# Patient Record
Sex: Male | Born: 1980 | Race: Black or African American | Hispanic: No | Marital: Single | Smoking: Former smoker
Health system: Southern US, Community
[De-identification: ages and names within clinical notes are randomized; demographics above are authoritative.]

## PROBLEM LIST (undated history)

## (undated) DIAGNOSIS — J45909 Unspecified asthma, uncomplicated: Secondary | ICD-10-CM

## (undated) DIAGNOSIS — K219 Gastro-esophageal reflux disease without esophagitis: Secondary | ICD-10-CM

## (undated) DIAGNOSIS — I10 Essential (primary) hypertension: Secondary | ICD-10-CM

## (undated) DIAGNOSIS — R51 Headache: Secondary | ICD-10-CM

## (undated) DIAGNOSIS — R519 Headache, unspecified: Secondary | ICD-10-CM

---

## 2003-07-01 ENCOUNTER — Emergency Department (HOSPITAL_COMMUNITY): Admission: EM | Admit: 2003-07-01 | Discharge: 2003-07-01 | Payer: Self-pay | Admitting: Emergency Medicine

## 2006-01-01 ENCOUNTER — Emergency Department (HOSPITAL_COMMUNITY): Admission: EM | Admit: 2006-01-01 | Discharge: 2006-01-01 | Payer: Self-pay | Admitting: Emergency Medicine

## 2013-11-15 ENCOUNTER — Encounter (HOSPITAL_COMMUNITY): Payer: Self-pay | Admitting: Emergency Medicine

## 2013-11-15 DIAGNOSIS — Y929 Unspecified place or not applicable: Secondary | ICD-10-CM | POA: Insufficient documentation

## 2013-11-15 DIAGNOSIS — Y939 Activity, unspecified: Secondary | ICD-10-CM | POA: Insufficient documentation

## 2013-11-15 DIAGNOSIS — S51809A Unspecified open wound of unspecified forearm, initial encounter: Secondary | ICD-10-CM | POA: Insufficient documentation

## 2013-11-15 DIAGNOSIS — F17200 Nicotine dependence, unspecified, uncomplicated: Secondary | ICD-10-CM | POA: Insufficient documentation

## 2013-11-15 DIAGNOSIS — W540XXA Bitten by dog, initial encounter: Secondary | ICD-10-CM | POA: Insufficient documentation

## 2013-11-15 DIAGNOSIS — Z23 Encounter for immunization: Secondary | ICD-10-CM | POA: Insufficient documentation

## 2013-11-15 NOTE — ED Notes (Signed)
The pt was bitten by his own dog earlier tonight [facility]  Bites to the lt forearm and wrist.  Good finger movement

## 2013-11-16 ENCOUNTER — Emergency Department (HOSPITAL_COMMUNITY)
Admission: EM | Admit: 2013-11-16 | Discharge: 2013-11-16 | Disposition: A | Discharge status: Home / Self Care | Payer: Self-pay | Attending: Emergency Medicine | Admitting: Emergency Medicine

## 2013-11-16 DIAGNOSIS — S51859A Open bite of unspecified forearm, initial encounter: Secondary | ICD-10-CM

## 2013-11-16 MED ORDER — TETANUS-DIPHTH-ACELL PERTUSSIS 5-2.5-18.5 LF-MCG/0.5 IM SUSP
0.5000 mL | Freq: Once | INTRAMUSCULAR | Status: AC
  Administered 2013-11-16: 0.5 mL via INTRAMUSCULAR
  Filled 2013-11-16: qty 0.5

## 2013-11-16 MED ORDER — RABIES VACCINE, PCEC IM SUSR
1.0000 mL | Freq: Once | INTRAMUSCULAR | Status: AC
Start: 2013-11-16 — End: 2013-11-16
  Administered 2013-11-16: 1 mL via INTRAMUSCULAR
  Filled 2013-11-16: qty 1

## 2013-11-16 MED ORDER — RABIES IMMUNE GLOBULIN 150 UNIT/ML IM INJ
20.0000 [IU]/kg | INJECTION | Freq: Once | INTRAMUSCULAR | Status: AC
  Administered 2013-11-16: 2400 [IU]
  Filled 2013-11-16: qty 16

## 2013-11-16 MED ORDER — AMOXICILLIN-POT CLAVULANATE 875-125 MG PO TABS: 1.0000 | ORAL_TABLET | Freq: Two times a day (BID) | ORAL | Status: DC

## 2013-11-16 NOTE — ED Notes (Addendum)
Bites noted to L proximal and distal FA. Bitten by his own pitbull dog at ~ 2200. Pt has had the dog for ~10 yrs. Td NOT UTD. Dogs vaccines NOT UTD. Dog is an outdoor dog and has NOT been acting right/normal. N/o h/o same. Pt agreeable to & prefers to receive rabies vaccine & IG. Also agreeable to animal control and quarantine. Plans on getting rid of dog. Bleeding controlled, no active bleeding at this time. ROM and CMS intact. BUE strong. Pt confirmed address & "best phone number" as [phone].

## 2013-11-16 NOTE — ED Provider Notes (Signed)
CSN: [id number]     Arrival date & time 11/15/13  2334 History   First MD Initiated Contact with Patient 11/16/13 0057     Chief Complaint  Patient presents with  . Animal Bite   (Consider location/radiation/quality/duration/timing/severity/associated sxs/prior Treatment) HPI Comments: Patient is a 33 year old male with no significant past medical history presents today after he was bitten by his dog earlier today. Dog is not UTD on his shots and patient denies UTD tetanus. Patient has 2 bite marks on the volar aspect of his left forearm, just distal to his elbow and just proximal to his wrist. He endorses some associated soft tissue swelling. Patient denies associated numbness/tingling, extremity weakness, and pallor. Patient is concerned about whether his dog may be rabid, because he states his dog has not been acting normally recently. Patient states his dog is primarily outdoors.  Patient is a 33 y.o. male presenting with animal bite. The history is provided by the patient. No language interpreter was used.  Animal Bite Associated symptoms: no fever and no numbness     History reviewed. No pertinent past medical history. History reviewed. No pertinent past surgical history. No family history on file. History  Substance Use Topics  . Smoking status: Current Every Day Smoker  . Smokeless tobacco: Not on file  . Alcohol Use: Yes    Review of Systems  Constitutional: Negative for fever.  Musculoskeletal: Positive for myalgias.  Skin: Positive for wound. Negative for pallor.  Neurological: Negative for weakness and numbness.  All other systems reviewed and are negative.    Allergies  Review of patient's allergies indicates no known allergies.  Home Medications   Current Outpatient Rx  Name  Route  Sig  Dispense  Refill  . albuterol (PROVENTIL HFA;VENTOLIN HFA) 108 (90 BASE) MCG/ACT inhaler   Inhalation   Inhale 2 puffs into the lungs every 6 (six) hours as needed for  wheezing or shortness of breath.         . Ibuprofen-Diphenhydramine Cit (ADVIL PM PO)   Oral   Take 1 tablet by mouth at bedtime as needed (sleep).         Marland Kitchen. amoxicillin-clavulanate (AUGMENTIN) 875-125 MG per tablet   Oral   Take 1 tablet by mouth 2 (two) times daily.   14 tablet   0    BP 179/115  Pulse 110  Temp(Src) 98.8 F (37.1 C) (Oral)  Resp 18  Wt 268 lb (121.564 kg)  SpO2 98% Physical Exam  Nursing note and vitals reviewed. Constitutional: He is oriented to person, place, and time. He appears well-developed and well-nourished. No distress.  HENT:  Head: Normocephalic and atraumatic.  Eyes: Conjunctivae and EOM are normal. No scleral icterus.  Neck: Normal range of motion.  Cardiovascular: Normal rate, regular rhythm and intact distal pulses.   Pulmonary/Chest: Effort normal. No respiratory distress.  Musculoskeletal: Normal range of motion.       Left forearm: He exhibits tenderness, swelling and laceration (Abrasion and superficial laceration x 3 each approx 2cm). He exhibits no bony tenderness, no edema and no deformity.       Arms: Normal range of motion of left elbow. Normal strength against resistance with flexion and extension of left elbow joint. Normal wrist movement.  Neurological: He is alert and oriented to person, place, and time.  No gross sensory deficits appreciated. Patient moves extremities without ataxia. Equal grip strength bilaterally.  Skin: Skin is warm and dry. No rash noted. He is not diaphoretic. No  pallor.  Psychiatric: He has a normal mood and affect. His behavior is normal.    ED Course  Procedures (including critical care time) Labs Review Labs Reviewed - No data to display Imaging Review No results found.  EKG Interpretation   None       MDM   1. Animal bite of forearm    Uncomplicated animal bite. Patient neurovascularly intact with normal strength and sensation in his left upper extremity. Tetanus updated in ED.  This patient is an outdoor dog does not updated on his rabies shots and has been acting unusually, will treat patient with first course of rabies vaccine today. Patient stable for discharge with instruction to return on day 3, 7, and 14 for subsequent rabies vaccinations. Will also discharge with seven-day course of Augmentin. Return precautions discussed and patient agreeable to plan with no unaddressed concerns.    Antony Madura, PA-C 11/16/13 [phone]

## 2013-11-16 NOTE — ED Notes (Signed)
Wound cleaned with saline & betadine. Bacitracin applied.

## 2013-11-16 NOTE — Discharge Instructions (Signed)
Return for your subsequent rabies vaccinations on 11/19/2013, 11/23/2013, and 11/30/2013. Take Augmentin as prescribed. Follow up with your primary care doctor as needed.  Animal Bite An animal bite can result in a scratch on the skin, deep open cut, puncture of the skin, crush injury, or tearing away of the skin or a body part. Dogs are responsible for most animal bites. Children are bitten more often than adults. An animal bite can range from very mild to more serious. A small bite from your house pet is no cause for alarm. However, some animal bites can become infected or injure a bone or other tissue. You must seek medical care if:  The skin is broken and bleeding does not slow down or stop after 15 minutes.  The puncture is deep and difficult to clean (such as a cat bite).  Pain, warmth, redness, or pus develops around the wound.  The bite is from a stray animal or rodent. There may be a risk of rabies infection.  The bite is from a snake, raccoon, skunk, fox, coyote, or bat. There may be a risk of rabies infection.  The person bitten has a chronic illness such as diabetes, liver disease, or cancer, or the person takes medicine that lowers the immune system.  There is concern about the location and severity of the bite. It is important to clean and protect an animal bite wound right away to prevent infection. Follow these steps:  Clean the wound with plenty of water and soap.  Apply an antibiotic cream.  Apply gentle pressure over the wound with a clean towel or gauze to slow or stop bleeding.  Elevate the affected area above the heart to help stop any bleeding.  Seek medical care. Getting medical care within 8 hours of the animal bite leads to the best possible outcome. DIAGNOSIS  Your caregiver will most likely:  Take a detailed history of the animal and the bite injury.  Perform a wound exam.  Take your medical history. Blood tests or X-rays may be performed. Sometimes,  infected bite wounds are cultured and sent to a lab to identify the infectious bacteria.  TREATMENT  Medical treatment will depend on the location and type of animal bite as well as the patient's medical history. Treatment may include:  Wound care, such as cleaning and flushing the wound with saline solution, bandaging, and elevating the affected area.  Antibiotics.  Tetanus immunization.  Rabies immunization.  Leaving the wound open to heal. This is often done with animal bites, due to the high risk of infection. However, in certain cases, wound closure with stitches, wound adhesive, skin adhesive strips, or staples may be used. Infected bites that are left untreated may require intravenous (IV) antibiotics and surgical treatment in the hospital. HOME CARE INSTRUCTIONS  Follow your caregiver's instructions for wound care.  Take all medicines as directed.  If your caregiver prescribes antibiotics, take them as directed. Finish them even if you start to feel better.  Follow up with your caregiver for further exams or immunizations as directed. You may need a tetanus shot if:  You cannot remember when you had your last tetanus shot.  You have never had a tetanus shot.  The injury broke your skin. If you get a tetanus shot, your arm may swell, get red, and feel warm to the touch. This is common and not a problem. If you need a tetanus shot and you choose not to have one, there is a rare chance  of getting tetanus. Sickness from tetanus can be serious. SEEK MEDICAL CARE IF:  You notice warmth, redness, soreness, swelling, pus discharge, or a bad smell coming from the wound.  You have a red line on the skin coming from the wound.  You have a fever, chills, or a general ill feeling.  You have nausea or vomiting.  You have continued or worsening pain.  You have trouble moving the injured part.  You have other questions or concerns. MAKE SURE YOU:  Understand these  instructions.  Will watch your condition.  Will get help right away if you are not doing well or get worse. Document Released: 07/05/2011 Document Revised: 01/09/2012 Document Reviewed: 07/05/2011 [facility] Patient Information 2014 [place], [place]. Laceration Care, Adult A laceration is a cut that goes through all layers of the skin. The cut goes into the tissue beneath the skin. HOME CARE For stitches (sutures) or staples:  Keep the cut clean and dry.  If you have a bandage (dressing), change it at least once a day. Change the bandage if it gets wet or dirty, or as told by your doctor.  Wash the cut with soap and water 2 times a day. Rinse the cut with water. Pat it dry with a clean towel.  Put a thin layer of medicated cream on the cut as told by your doctor.  You may shower after the first 24 hours. Do not soak the cut in water until the stitches are removed.  Only take medicines as told by your doctor.  Have your stitches or staples removed as told by your doctor. For skin adhesive strips:  Keep the cut clean and dry.  Do not get the strips wet. You may take a bath, but be careful to keep the cut dry.  If the cut gets wet, pat it dry with a clean towel.  The strips will fall off on their own. Do not remove the strips that are still stuck to the cut. For wound glue:  You may shower or take baths. Do not soak or scrub the cut. Do not swim. Avoid heavy sweating until the glue falls off on its own. After a shower or bath, pat the cut dry with a clean towel.  Do not put medicine on your cut until the glue falls off.  If you have a bandage, do not put tape over the glue.  Avoid lots of sunlight or tanning lamps until the glue falls off. Put sunscreen on the cut for the first year to reduce your scar.  The glue will fall off on its own. Do not pick at the glue. You may need a tetanus shot if:  You cannot remember when you had your last tetanus shot.  You have never had a  tetanus shot. If you need a tetanus shot and you choose not to have one, you may get tetanus. Sickness from tetanus can be serious. GET HELP RIGHT AWAY IF:   Your pain does not get better with medicine.  Your arm, hand, leg, or foot loses feeling (numbness) or changes color.  Your cut is bleeding.  Your joint feels weak, or you cannot use your joint.  You have painful lumps on your body.  Your cut is red, puffy (swollen), or painful.  You have a red line on the skin near the cut.  You have yellowish-white fluid (pus) coming from the cut.  You have a fever.  You have a bad smell coming from the cut or  bandage.  Your cut breaks open before or after stitches are removed.  You notice something coming out of the cut, such as wood or glass.  You cannot move a finger or toe. MAKE SURE YOU:   Understand these instructions.  Will watch your condition.  Will get help right away if you are not doing well or get worse. Document Released: 04/04/2008 Document Revised: 01/09/2012 Document Reviewed: 04/12/2011 [facility] Patient Information 2014 [place], [place].

## 2013-11-17 NOTE — ED Provider Notes (Signed)
Medical screening examination/treatment/procedure(s) were performed by non-physician practitioner and as supervising physician I was immediately available for consultation/collaboration.  EKG Interpretation   None         Julie Manly, MD 11/17/13 0210 

## 2013-11-21 ENCOUNTER — Encounter (HOSPITAL_COMMUNITY): Payer: Self-pay | Admitting: Emergency Medicine

## 2013-11-21 ENCOUNTER — Emergency Department (INDEPENDENT_AMBULATORY_CARE_PROVIDER_SITE_OTHER)
Admission: EM | Admit: 2013-11-21 | Discharge: 2013-11-21 | Disposition: A | Discharge status: Home / Self Care | Payer: Self-pay | Source: Home / Self Care

## 2013-11-21 DIAGNOSIS — Z203 Contact with and (suspected) exposure to rabies: Secondary | ICD-10-CM

## 2013-11-21 MED ORDER — RABIES VACCINE, PCEC IM SUSR
1.0000 mL | Freq: Once | INTRAMUSCULAR | Status: AC
  Administered 2013-11-21: 1 mL via INTRAMUSCULAR

## 2013-11-21 MED ORDER — RABIES VACCINE, PCEC IM SUSR
INTRAMUSCULAR | Status: AC
  Filled 2013-11-21: qty 1

## 2013-11-21 NOTE — ED Notes (Signed)
Pt is here for day 1 rabies vaccination Reports he did not come in on 1/20 as he was scheduled b/c he did not know how this process worked Voices no new concerns Alert w/no signs of acute distress.   Pt has been adv that he will be coming on: 01/25 (day 3) 01/28 (day 7) 01/31 (day 14)  Consulted w/Dr. Clinton SawyerWilliamson and he said it was ok.

## 2013-11-21 NOTE — Discharge Instructions (Signed)
Please return on  01/25 (day 3) 01/28 (day 7) 01/31 (day 14)

## 2014-10-14 ENCOUNTER — Encounter (HOSPITAL_COMMUNITY): Payer: Self-pay | Admitting: Emergency Medicine

## 2014-10-14 ENCOUNTER — Emergency Department (INDEPENDENT_AMBULATORY_CARE_PROVIDER_SITE_OTHER)
Admission: EM | Admit: 2014-10-14 | Discharge: 2014-10-14 | Disposition: A | Discharge status: Home / Self Care | Payer: Self-pay | Source: Home / Self Care | Attending: Emergency Medicine | Admitting: Emergency Medicine

## 2014-10-14 DIAGNOSIS — G43009 Migraine without aura, not intractable, without status migrainosus: Secondary | ICD-10-CM

## 2014-10-14 HISTORY — DX: Gastro-esophageal reflux disease without esophagitis: K21.9

## 2014-10-14 HISTORY — DX: Headache, unspecified: R51.9

## 2014-10-14 HISTORY — DX: Headache: R51

## 2014-10-14 MED ORDER — METOCLOPRAMIDE HCL 5 MG/ML IJ SOLN
INTRAMUSCULAR | Status: AC
  Filled 2014-10-14: qty 2

## 2014-10-14 MED ORDER — METOCLOPRAMIDE HCL 5 MG/ML IJ SOLN
10.0000 mg | Freq: Once | INTRAMUSCULAR | Status: AC
  Administered 2014-10-14: 10 mg via INTRAMUSCULAR

## 2014-10-14 MED ORDER — DICLOFENAC SODIUM 75 MG PO TBEC: DELAYED_RELEASE_TABLET | ORAL | Status: DC

## 2014-10-14 MED ORDER — KETOROLAC TROMETHAMINE 60 MG/2ML IM SOLN
60.0000 mg | Freq: Once | INTRAMUSCULAR | Status: AC
  Administered 2014-10-14: 60 mg via INTRAMUSCULAR

## 2014-10-14 MED ORDER — METOCLOPRAMIDE HCL 10 MG PO TABS: ORAL_TABLET | ORAL | Status: DC

## 2014-10-14 MED ORDER — KETOROLAC TROMETHAMINE 60 MG/2ML IM SOLN
INTRAMUSCULAR | Status: AC
  Filled 2014-10-14: qty 2

## 2014-10-14 NOTE — ED Notes (Signed)
C/o headache onset yesterday.  Has been having headaches severely for 3-4 yrs.  Has never had a CT scan.

## 2014-10-14 NOTE — ED Provider Notes (Signed)
Chief Complaint   No chief complaint on file.   History of Present Illness   Dakota Doyle is a 33 year old male has a 2 day history of severe, generalized, pressure-like headache rated 10 over 10 in intensity. This has been associated with dizziness, light and sound sensitivity, as worse with activity. He denies any nausea or vomiting. He's had no fever, chills, diplopia, blurry vision, or any Visual disturbance. He denies nasal congestion, rhinorrhea, sore throat, or stiff neck. He's had no paresthesias, numbness, tingling, weakness, difficulty walking, difficulty using his hands, difficulty with speech or swallowing, problems with equilibrium, or balance.  The patient states he has had similar headaches for several years. He thinks these are tension type headaches. He has seen a physician for these in the past, but never taken any specific treatment for them. He's never seen a headache specialist. He was diagnosed with migraines when he was a child. He's tried over-the-counter medications such as Aleve and Excedrin with fair results. The patient denies that this is the worst headache of his life. He states that it similar to the typical headaches he's gotten in the past.  He does not take anticoagulants and has been no head or neck trauma. He denies any fever or stiff neck. Headache was not sudden in onset. No one else in the family or in the household has had a similar headache. He denies any eye pain or blurry vision he denies any jaw claudication. He's had no history of blood clots or thrombophlebitis.  Review of Systems   Other than as noted above, the patient denies any of the following symptoms: Systemic:  No fever, chills, photophobia, or stiff neck. Eye:  No blurred vision, or diplopia. ENT:  No nasal congestion, rhinorrhea, sinus pressure or pain, or sore throat.  No jaw claudication. Neuro:  No paresthesias, loss of consciousness, seizure activity, muscle weakness, trouble with  coordination or gait, trouble speaking or swallowing. Psych:  No depression, anxiety or trouble sleeping.  PMFSH   Past medical history, family history, social history, meds, and allergies were reviewed.    Physical Examination    Vital signs:  BP 139/94 mmHg  Pulse 82  Temp(Src) 98.8 F (37.1 C) (Oral)  Resp 18  SpO2 95% General:  Alert and oriented.  In no distress. Eye:  Lids and conjunctivas normal.  PERRL,  Full EOMs.  Fundi were not well-visualized.  ENT:  No cranial or facial tenderness to palpation.  TMs and canals clear.  Nasal mucosa was normal and uncongested without any drainage. No intra oral lesions, pharynx clear, mucous membranes moist, dentition normal. Neck:  Supple, full ROM, no tenderness to palpation.  No adenopathy or mass. Neuro:  Alert and orented times 3.  Speech was clear, fluent, and appropriate.  Cranial nerves intact. No pronator drift, muscle strength normal. Finger to nose normal.  DTRs were 2+ and symmetrical.Station and gait were normal.  Romberg's sign was normal.  Able to perform tandem gait well. Psych:  Normal affect.   Course in Urgent Care Center   The following medications were given:  Medications  ketorolac (TORADOL) injection 60 mg   metoCLOPramide (REGLAN) injection 10 mg    Assessment   The encounter diagnosis was Migraine without aura and without status migrainosus, not intractable.  There is no evidence of subarachnoid hemorrhage, meningitis, encephalitis, subdural hematoma, epidural hematoma, acute glaucoma, carotid dissection, temporal arteritis, cavernous sinus thrombosis, carbon monoxide toxicity, or pseudotumor cerebri.    Plan   1.  Meds:  The following meds were prescribed:   New Prescriptions   DICLOFENAC (VOLTAREN) 75 MG EC TABLET    1 every 12 hours as needed for headache   METOCLOPRAMIDE (REGLAN) 10 MG TABLET    Take 1 every 12 hours as needed for headache.    2.  Patient Education/Counseling:  The patient was given  appropriate handouts, self care instructions, and instructed in pain control.  May use Excedrin Migraine for over-the-counter medication. Instructed to take Benadryl 50 mg at bedtime tonight.  3.  Follow up:  The patient was told to follow up here if no better in 2 to 3 days, or sooner if becoming worse in any way, and given some red flag symptoms such as fever, worsening pain, persistent vomiting, or new neurological symptoms which would prompt immediate return.  Follow-up with Dr. Karenann CaiElliott Lewit.     Reuben Likesavid C Emmarose Klinke, MD 10/14/14 2011

## 2014-10-14 NOTE — Discharge Instructions (Signed)

## 2015-02-22 ENCOUNTER — Emergency Department (HOSPITAL_COMMUNITY): Payer: Self-pay

## 2015-02-22 ENCOUNTER — Emergency Department (HOSPITAL_COMMUNITY)
Admission: EM | Admit: 2015-02-22 | Discharge: 2015-02-22 | Disposition: A | Discharge status: Home / Self Care | Payer: Self-pay | Attending: Emergency Medicine | Admitting: Emergency Medicine

## 2015-02-22 ENCOUNTER — Encounter (HOSPITAL_COMMUNITY): Payer: Self-pay | Admitting: Emergency Medicine

## 2015-02-22 DIAGNOSIS — Y999 Unspecified external cause status: Secondary | ICD-10-CM | POA: Insufficient documentation

## 2015-02-22 DIAGNOSIS — Y929 Unspecified place or not applicable: Secondary | ICD-10-CM | POA: Insufficient documentation

## 2015-02-22 DIAGNOSIS — S028XXA Fractures of other specified skull and facial bones, initial encounter for closed fracture: Secondary | ICD-10-CM | POA: Insufficient documentation

## 2015-02-22 DIAGNOSIS — S20319A Abrasion of unspecified front wall of thorax, initial encounter: Secondary | ICD-10-CM | POA: Insufficient documentation

## 2015-02-22 DIAGNOSIS — Y939 Activity, unspecified: Secondary | ICD-10-CM | POA: Insufficient documentation

## 2015-02-22 DIAGNOSIS — Z792 Long term (current) use of antibiotics: Secondary | ICD-10-CM | POA: Insufficient documentation

## 2015-02-22 DIAGNOSIS — Z720 Tobacco use: Secondary | ICD-10-CM | POA: Insufficient documentation

## 2015-02-22 DIAGNOSIS — S0285XA Fracture of orbit, unspecified, initial encounter for closed fracture: Secondary | ICD-10-CM

## 2015-02-22 DIAGNOSIS — Z8719 Personal history of other diseases of the digestive system: Secondary | ICD-10-CM | POA: Insufficient documentation

## 2015-02-22 DIAGNOSIS — H11422 Conjunctival edema, left eye: Secondary | ICD-10-CM | POA: Insufficient documentation

## 2015-02-22 DIAGNOSIS — S00212A Abrasion of left eyelid and periocular area, initial encounter: Secondary | ICD-10-CM | POA: Insufficient documentation

## 2015-02-22 DIAGNOSIS — S0012XA Contusion of left eyelid and periocular area, initial encounter: Secondary | ICD-10-CM | POA: Insufficient documentation

## 2015-02-22 DIAGNOSIS — H05232 Hemorrhage of left orbit: Secondary | ICD-10-CM

## 2015-02-22 DIAGNOSIS — S40212A Abrasion of left shoulder, initial encounter: Secondary | ICD-10-CM | POA: Insufficient documentation

## 2015-02-22 LAB — URINALYSIS, ROUTINE W REFLEX MICROSCOPIC
Bilirubin Urine: NEGATIVE
GLUCOSE, UA: NEGATIVE mg/dL
HGB URINE DIPSTICK: NEGATIVE
Ketones, ur: NEGATIVE mg/dL
LEUKOCYTES UA: NEGATIVE
NITRITE: NEGATIVE
PROTEIN: NEGATIVE mg/dL
SPECIFIC GRAVITY, URINE: 1.023 (ref 1.005–1.030)
Urobilinogen, UA: 0.2 mg/dL (ref 0.0–1.0)
pH: 5.5 (ref 5.0–8.0)

## 2015-02-22 LAB — BASIC METABOLIC PANEL
ANION GAP: 12 (ref 5–15)
BUN: 10 mg/dL (ref 6–23)
CALCIUM: 8.8 mg/dL (ref 8.4–10.5)
CHLORIDE: 103 mmol/L (ref 96–112)
CO2: 24 mmol/L (ref 19–32)
CREATININE: 1.05 mg/dL (ref 0.50–1.35)
GFR calc Af Amer: >90 mL/min (ref >90)
GFR calc non Af Amer: >90 mL/min (ref >90)
GLUCOSE: 142 mg/dL — AB (ref 70–99)
Potassium: 4 mmol/L (ref 3.5–5.1)
Sodium: 139 mmol/L (ref 135–145)

## 2015-02-22 LAB — CBC WITH DIFFERENTIAL/PLATELET
BASOS ABS: 0 10*3/uL (ref 0.0–0.1)
Basophils Relative: 0 % (ref 0–1)
EOS ABS: 0 10*3/uL (ref 0.0–0.7)
EOS PCT: 0 % (ref 0–5)
HEMATOCRIT: 40.1 % (ref 39.0–52.0)
Hemoglobin: 13.2 g/dL (ref 13.0–17.0)
Lymphocytes Relative: 12 % (ref 12–46)
Lymphs Abs: 1.3 10*3/uL (ref 0.7–4.0)
MCH: 31.8 pg (ref 26.0–34.0)
MCHC: 32.9 g/dL (ref 30.0–36.0)
MCV: 96.6 fL (ref 78.0–100.0)
Monocytes Absolute: 0.3 10*3/uL (ref 0.1–1.0)
Monocytes Relative: 3 % (ref 3–12)
Neutro Abs: 8.7 10*3/uL — ABNORMAL HIGH (ref 1.7–7.7)
Neutrophils Relative %: 85 % — ABNORMAL HIGH (ref 43–77)
Platelets: 237 10*3/uL (ref 150–400)
RBC: 4.15 MIL/uL — ABNORMAL LOW (ref 4.22–5.81)
RDW: 12.8 % (ref 11.5–15.5)
WBC: 10.3 10*3/uL (ref 4.0–10.5)

## 2015-02-22 LAB — ETHANOL: ALCOHOL ETHYL (B): 152 mg/dL — AB (ref 0–9)

## 2015-02-22 LAB — RAPID URINE DRUG SCREEN, HOSP PERFORMED
AMPHETAMINES: NOT DETECTED
BARBITURATES: NOT DETECTED
Benzodiazepines: NOT DETECTED
COCAINE: NOT DETECTED
OPIATES: POSITIVE — AB
Tetrahydrocannabinol: POSITIVE — AB

## 2015-02-22 LAB — PROTIME-INR
INR: 1.07 (ref 0.00–1.49)
Prothrombin Time: 14.1 seconds (ref 11.6–15.2)

## 2015-02-22 MED ORDER — TIMOLOL MALEATE 0.5 % OP SOLN: 1.0000 [drp] | Freq: Two times a day (BID) | OPHTHALMIC | Status: DC

## 2015-02-22 MED ORDER — TIMOLOL MALEATE 0.5 % OP SOLN
1.0000 [drp] | Freq: Once | OPHTHALMIC | Status: AC
  Administered 2015-02-22: 1 [drp] via OPHTHALMIC
  Filled 2015-02-22: qty 5

## 2015-02-22 MED ORDER — ONDANSETRON 4 MG PO TBDP
4.0000 mg | ORAL_TABLET | Freq: Once | ORAL | Status: AC
  Administered 2015-02-22: 4 mg via ORAL
  Filled 2015-02-22: qty 1

## 2015-02-22 MED ORDER — MORPHINE SULFATE 4 MG/ML IJ SOLN
4.0000 mg | Freq: Once | INTRAMUSCULAR | Status: AC
  Administered 2015-02-22: 4 mg via INTRAVENOUS
  Filled 2015-02-22: qty 1

## 2015-02-22 MED ORDER — HYDROCODONE-ACETAMINOPHEN 5-325 MG PO TABS: 2.0000 | ORAL_TABLET | ORAL | Status: DC | PRN

## 2015-02-22 MED ORDER — ONDANSETRON HCL 4 MG/2ML IJ SOLN
4.0000 mg | Freq: Once | INTRAMUSCULAR | Status: AC
  Administered 2015-02-22: 4 mg via INTRAVENOUS
  Filled 2015-02-22: qty 2

## 2015-02-22 MED ORDER — ONDANSETRON 8 MG PO TBDP: 8.0000 mg | ORAL_TABLET | Freq: Three times a day (TID) | ORAL | Status: DC | PRN

## 2015-02-22 NOTE — ED Notes (Signed)
Phlebotomy at the bedside  
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
## 2015-02-22 NOTE — ED Notes (Signed)
Reported patient's visual acuity screening and ambulation to Dr. Norlene Campbelltter. No new orders at this time.
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
## 2016-07-12 ENCOUNTER — Ambulatory Visit (HOSPITAL_COMMUNITY)
Admission: EM | Admit: 2016-07-12 | Discharge: 2016-07-12 | Disposition: A | Discharge status: Home / Self Care | Payer: No Typology Code available for payment source | Attending: Family Medicine | Admitting: Family Medicine

## 2016-07-12 DIAGNOSIS — S86012A Strain of left Achilles tendon, initial encounter: Secondary | ICD-10-CM

## 2016-07-12 MED ORDER — NAPROXEN 375 MG PO TABS: 375.0000 mg | ORAL_TABLET | Freq: Two times a day (BID) | ORAL | 0 refills | Status: AC
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
## 2016-07-12 NOTE — Discharge Instructions (Signed)
Keep ankle splinted. See the orthopedist as soon as possible.Limit ambulation.

## 2016-07-15 ENCOUNTER — Other Ambulatory Visit (HOSPITAL_COMMUNITY): Payer: Self-pay | Admitting: Orthopedic Surgery

## 2016-07-15 DIAGNOSIS — M25572 Pain in left ankle and joints of left foot: Secondary | ICD-10-CM

## 2016-07-22 ENCOUNTER — Ambulatory Visit (HOSPITAL_COMMUNITY)
Admission: RE | Admit: 2016-07-22 | Discharge: 2016-07-22 | Disposition: A | Discharge status: Home / Self Care | Payer: No Typology Code available for payment source | Source: Ambulatory Visit | Attending: Orthopedic Surgery | Admitting: Orthopedic Surgery

## 2016-07-22 DIAGNOSIS — M25472 Effusion, left ankle: Secondary | ICD-10-CM | POA: Insufficient documentation

## 2016-07-22 DIAGNOSIS — X58XXXA Exposure to other specified factors, initial encounter: Secondary | ICD-10-CM | POA: Insufficient documentation

## 2016-07-22 DIAGNOSIS — S86012A Strain of left Achilles tendon, initial encounter: Secondary | ICD-10-CM | POA: Insufficient documentation

## 2016-07-22 DIAGNOSIS — M25572 Pain in left ankle and joints of left foot: Secondary | ICD-10-CM | POA: Diagnosis present

## 2016-08-17 ENCOUNTER — Other Ambulatory Visit (HOSPITAL_COMMUNITY): Payer: Self-pay | Admitting: Family

## 2016-09-05 NOTE — Pre-Procedure Instructions (Signed)
    [facility]  09/05/2016      [id number] G[place] Otto- [place], [place] - [address] [place] [address] Phone: [phone] Fax: [phone]    Your procedure is scheduled on 09/09/16.  [facility]  [facility] Ad[facility] at [phone] A.M.  Call this number if you have problems the morning of surgery:  [phone]   Remember:  Do not eat food or drink liquids after midnight.  Take these medicines the morning of surgery with A SIP OF WATER --tylenol,metoprolol   Do not wear jewelry, make-up or nail polish.  Do not wear lotions, powders, or perfumes, or deoderant.  Do not shave 48 hours prior to surgery.  Men may shave face and neck.  Do not bring valuables to the hospital.  [facility] is not responsible for any belongings or valuables.  Contacts, dentures or bridgework may not be worn into surgery.  Leave your suitcase in the car.  After surgery it may be brought to your room.  For patients admitted to the hospital, discharge time will be determined by your treatment team.  Patients discharged the day of surgery will not be allowed to drive home.   Name and phone number of your driver:   Special instructions:  Do not take any aspirin,anti-inflammatories,vitamins,or herbal supplements 5-7 days prior to surgery.  Please read over the following fact sheets that you were given.

## 2016-09-06 ENCOUNTER — Other Ambulatory Visit: Payer: Self-pay

## 2016-09-06 ENCOUNTER — Encounter (HOSPITAL_COMMUNITY)
Admission: RE | Admit: 2016-09-06 | Discharge: 2016-09-06 | Disposition: A | Discharge status: Home / Self Care | Payer: No Typology Code available for payment source | Source: Ambulatory Visit | Attending: Orthopedic Surgery | Admitting: Orthopedic Surgery

## 2016-09-06 ENCOUNTER — Encounter (HOSPITAL_COMMUNITY): Payer: Self-pay

## 2016-09-06 DIAGNOSIS — Z87891 Personal history of nicotine dependence: Secondary | ICD-10-CM | POA: Diagnosis not present

## 2016-09-06 DIAGNOSIS — Z79899 Other long term (current) drug therapy: Secondary | ICD-10-CM | POA: Diagnosis not present

## 2016-09-06 DIAGNOSIS — Z791 Long term (current) use of non-steroidal anti-inflammatories (NSAID): Secondary | ICD-10-CM | POA: Diagnosis not present

## 2016-09-06 DIAGNOSIS — X58XXXA Exposure to other specified factors, initial encounter: Secondary | ICD-10-CM | POA: Diagnosis not present

## 2016-09-06 DIAGNOSIS — K219 Gastro-esophageal reflux disease without esophagitis: Secondary | ICD-10-CM | POA: Diagnosis not present

## 2016-09-06 DIAGNOSIS — Y9367 Activity, basketball: Secondary | ICD-10-CM | POA: Diagnosis not present

## 2016-09-06 DIAGNOSIS — S86012A Strain of left Achilles tendon, initial encounter: Secondary | ICD-10-CM | POA: Diagnosis present

## 2016-09-06 DIAGNOSIS — I10 Essential (primary) hypertension: Secondary | ICD-10-CM | POA: Diagnosis not present

## 2016-09-06 DIAGNOSIS — J45909 Unspecified asthma, uncomplicated: Secondary | ICD-10-CM | POA: Diagnosis not present

## 2016-09-06 HISTORY — DX: Unspecified asthma, uncomplicated: J45.909

## 2016-09-06 HISTORY — DX: Essential (primary) hypertension: I10

## 2016-09-06 LAB — CBC
HEMATOCRIT: 39.7 % (ref 39.0–52.0)
HEMOGLOBIN: 13.1 g/dL (ref 13.0–17.0)
MCH: 31.6 pg (ref 26.0–34.0)
MCHC: 33 g/dL (ref 30.0–36.0)
MCV: 95.7 fL (ref 78.0–100.0)
Platelets: 241 10*3/uL (ref 150–400)
RBC: 4.15 MIL/uL — ABNORMAL LOW (ref 4.22–5.81)
RDW: 12.7 % (ref 11.5–15.5)
WBC: 4.2 10*3/uL (ref 4.0–10.5)

## 2016-09-06 LAB — BASIC METABOLIC PANEL
Anion gap: 6 (ref 5–15)
BUN: 11 mg/dL (ref 6–20)
CO2: 27 mmol/L (ref 22–32)
CREATININE: 0.95 mg/dL (ref 0.61–1.24)
Calcium: 9.5 mg/dL (ref 8.9–10.3)
Chloride: 107 mmol/L (ref 101–111)
Glucose, Bld: 115 mg/dL — ABNORMAL HIGH (ref 65–99)
Potassium: 4 mmol/L (ref 3.5–5.1)
SODIUM: 140 mmol/L (ref 135–145)

## 2016-09-06 NOTE — Progress Notes (Signed)
   09/06/16 0831  OBSTRUCTIVE SLEEP APNEA  Have you ever been diagnosed with sleep apnea through a sleep study? No  Do you snore loudly (loud enough to be heard through closed doors)?  0  Do you often feel tired, fatigued, or sleepy during the daytime (such as falling asleep during driving or talking to someone)? 0  Has anyone observed you stop breathing during your sleep? 1  Do you have, or are you being treated for high blood pressure? 1  BMI more than 35 kg/m2? 1  Age > 50 (1-yes) 0  Neck circumference greater than:Male 16 inches or larger, Male 17inches or larger? 1  Male Gender (Yes=1) 1  Obstructive Sleep Apnea Score 5  Score 5 or greater  Results sent to PCP

## 2016-09-06 NOTE — Progress Notes (Signed)
PCP: Dr. Tacy DuraHarwani--will request notes/ekg if available

## 2016-09-08 MED ORDER — DEXTROSE 5 % IV SOLN
3.0000 g | INTRAVENOUS | Status: AC
  Administered 2016-09-09: 3 g via INTRAVENOUS
  Filled 2016-09-08: qty 3000

## 2016-09-09 ENCOUNTER — Encounter (HOSPITAL_COMMUNITY)
Admission: RE | Disposition: A | Discharge status: Home / Self Care | Payer: Self-pay | Source: Ambulatory Visit | Attending: Orthopedic Surgery

## 2016-09-09 ENCOUNTER — Ambulatory Visit (HOSPITAL_COMMUNITY): Payer: No Typology Code available for payment source | Admitting: Anesthesiology

## 2016-09-09 ENCOUNTER — Ambulatory Visit (HOSPITAL_COMMUNITY)
Admission: RE | Admit: 2016-09-09 | Discharge: 2016-09-09 | Disposition: A | Discharge status: Home / Self Care | Payer: No Typology Code available for payment source | Source: Ambulatory Visit | Attending: Orthopedic Surgery | Admitting: Orthopedic Surgery

## 2016-09-09 ENCOUNTER — Encounter (HOSPITAL_COMMUNITY): Payer: Self-pay | Admitting: Anesthesiology

## 2016-09-09 DIAGNOSIS — S86012S Strain of left Achilles tendon, sequela: Secondary | ICD-10-CM

## 2016-09-09 DIAGNOSIS — J45909 Unspecified asthma, uncomplicated: Secondary | ICD-10-CM | POA: Insufficient documentation

## 2016-09-09 DIAGNOSIS — Z79899 Other long term (current) drug therapy: Secondary | ICD-10-CM | POA: Insufficient documentation

## 2016-09-09 DIAGNOSIS — Z87891 Personal history of nicotine dependence: Secondary | ICD-10-CM | POA: Insufficient documentation

## 2016-09-09 DIAGNOSIS — I10 Essential (primary) hypertension: Secondary | ICD-10-CM | POA: Insufficient documentation

## 2016-09-09 DIAGNOSIS — X58XXXA Exposure to other specified factors, initial encounter: Secondary | ICD-10-CM | POA: Insufficient documentation

## 2016-09-09 DIAGNOSIS — Y9367 Activity, basketball: Secondary | ICD-10-CM | POA: Insufficient documentation

## 2016-09-09 DIAGNOSIS — Z791 Long term (current) use of non-steroidal anti-inflammatories (NSAID): Secondary | ICD-10-CM | POA: Insufficient documentation

## 2016-09-09 DIAGNOSIS — S86012A Strain of left Achilles tendon, initial encounter: Secondary | ICD-10-CM | POA: Diagnosis not present

## 2016-09-09 DIAGNOSIS — K219 Gastro-esophageal reflux disease without esophagitis: Secondary | ICD-10-CM | POA: Insufficient documentation

## 2016-09-09 HISTORY — PX: ACHILLES TENDON SURGERY: SHX542

## 2016-09-09 SURGERY — REPAIR, TENDON, ACHILLES
Anesthesia: Regional | Laterality: Left

## 2016-09-09 MED ORDER — SUCCINYLCHOLINE CHLORIDE 200 MG/10ML IV SOSY
PREFILLED_SYRINGE | INTRAVENOUS | Status: AC
  Filled 2016-09-09: qty 10

## 2016-09-09 MED ORDER — ARTIFICIAL TEARS OP OINT
TOPICAL_OINTMENT | OPHTHALMIC | Status: AC
  Filled 2016-09-09: qty 3.5

## 2016-09-09 MED ORDER — PROPOFOL 10 MG/ML IV BOLUS
INTRAVENOUS | Status: AC
  Filled 2016-09-09: qty 20

## 2016-09-09 MED ORDER — ROCURONIUM BROMIDE 10 MG/ML (PF) SYRINGE
PREFILLED_SYRINGE | INTRAVENOUS | Status: AC
  Filled 2016-09-09: qty 10

## 2016-09-09 MED ORDER — LACTATED RINGERS IV SOLN
INTRAVENOUS | Status: DC | PRN
  Administered 2016-09-09: 12:00:00 via INTRAVENOUS

## 2016-09-09 MED ORDER — LIDOCAINE HCL (CARDIAC) 20 MG/ML IV SOLN
INTRAVENOUS | Status: DC | PRN
  Administered 2016-09-09: 100 mg via INTRAVENOUS

## 2016-09-09 MED ORDER — OXYCODONE-ACETAMINOPHEN 5-325 MG PO TABS: 1.0000 | ORAL_TABLET | ORAL | 0 refills | Status: DC | PRN

## 2016-09-09 MED ORDER — FENTANYL CITRATE (PF) 100 MCG/2ML IJ SOLN
INTRAMUSCULAR | Status: AC
  Filled 2016-09-09: qty 2

## 2016-09-09 MED ORDER — ONDANSETRON HCL 4 MG/2ML IJ SOLN
INTRAMUSCULAR | Status: DC | PRN
  Administered 2016-09-09: 4 mg via INTRAVENOUS

## 2016-09-09 MED ORDER — SUGAMMADEX SODIUM 200 MG/2ML IV SOLN
INTRAVENOUS | Status: AC
  Filled 2016-09-09: qty 2

## 2016-09-09 MED ORDER — EPHEDRINE 5 MG/ML INJ
INTRAVENOUS | Status: AC
  Filled 2016-09-09: qty 10

## 2016-09-09 MED ORDER — PHENYLEPHRINE 40 MCG/ML (10ML) SYRINGE FOR IV PUSH (FOR BLOOD PRESSURE SUPPORT)
PREFILLED_SYRINGE | INTRAVENOUS | Status: AC
  Filled 2016-09-09: qty 10

## 2016-09-09 MED ORDER — SUCCINYLCHOLINE CHLORIDE 20 MG/ML IJ SOLN
INTRAMUSCULAR | Status: DC | PRN
  Administered 2016-09-09: 120 mg via INTRAVENOUS

## 2016-09-09 MED ORDER — LACTATED RINGERS IV SOLN
INTRAVENOUS | Status: DC
  Administered 2016-09-09: 12:00:00 via INTRAVENOUS

## 2016-09-09 MED ORDER — ONDANSETRON HCL 4 MG/2ML IJ SOLN
INTRAMUSCULAR | Status: AC
  Filled 2016-09-09: qty 2

## 2016-09-09 MED ORDER — FENTANYL CITRATE (PF) 100 MCG/2ML IJ SOLN
INTRAMUSCULAR | Status: DC | PRN
  Administered 2016-09-09 (×2): 100 ug via INTRAVENOUS
  Administered 2016-09-09: 50 ug via INTRAVENOUS

## 2016-09-09 MED ORDER — CHLORHEXIDINE GLUCONATE 4 % EX LIQD: 60.0000 mL | Freq: Once | CUTANEOUS | Status: DC

## 2016-09-09 MED ORDER — ROPIVACAINE HCL 7.5 MG/ML IJ SOLN
INTRAMUSCULAR | Status: DC | PRN
  Administered 2016-09-09: 20 mL via PERINEURAL

## 2016-09-09 MED ORDER — MIDAZOLAM HCL 2 MG/2ML IJ SOLN
INTRAMUSCULAR | Status: AC
  Filled 2016-09-09: qty 2

## 2016-09-09 MED ORDER — 0.9 % SODIUM CHLORIDE (POUR BTL) OPTIME
TOPICAL | Status: DC | PRN
  Administered 2016-09-09: 1000 mL

## 2016-09-09 MED ORDER — LIDOCAINE 2% (20 MG/ML) 5 ML SYRINGE
INTRAMUSCULAR | Status: AC
  Filled 2016-09-09: qty 5

## 2016-09-09 MED ORDER — PROPOFOL 10 MG/ML IV BOLUS
INTRAVENOUS | Status: DC | PRN
  Administered 2016-09-09: 200 mg via INTRAVENOUS
  Administered 2016-09-09: 60 mg via INTRAVENOUS

## 2016-09-09 MED ORDER — MIDAZOLAM HCL 5 MG/5ML IJ SOLN
INTRAMUSCULAR | Status: DC | PRN
  Administered 2016-09-09: 2 mg via INTRAVENOUS

## 2016-09-09 SURGICAL SUPPLY — 39 items
BANDAGE ACE 6X5 VEL STRL LF (GAUZE/BANDAGES/DRESSINGS) ×2 IMPLANT
BNDG COHESIVE 6X5 TAN STRL LF (GAUZE/BANDAGES/DRESSINGS) ×4 IMPLANT
BNDG GAUZE ELAST 4 BULKY (GAUZE/BANDAGES/DRESSINGS) ×2 IMPLANT
CANISTER SUCTION 2500CC (MISCELLANEOUS) ×2 IMPLANT
COVER SURGICAL LIGHT HANDLE (MISCELLANEOUS) ×2 IMPLANT
DRAPE U-SHAPE 47X51 STRL (DRAPES) ×2 IMPLANT
DRSG ADAPTIC 3X8 NADH LF (GAUZE/BANDAGES/DRESSINGS) ×2 IMPLANT
DURAPREP 26ML APPLICATOR (WOUND CARE) ×2 IMPLANT
ELECT REM PT RETURN 9FT ADLT (ELECTROSURGICAL) ×2
ELECTRODE REM PT RTRN 9FT ADLT (ELECTROSURGICAL) ×1 IMPLANT
GAUZE SPONGE 4X4 12PLY STRL (GAUZE/BANDAGES/DRESSINGS) ×2 IMPLANT
GLOVE BIOGEL PI IND STRL 9 (GLOVE) ×1 IMPLANT
GLOVE BIOGEL PI INDICATOR 9 (GLOVE) ×1
GLOVE SURG ORTHO 9.0 STRL STRW (GLOVE) ×2 IMPLANT
GLOVE SURG SS PI 6.5 STRL IVOR (GLOVE) ×4 IMPLANT
GOWN STRL REUS W/ TWL LRG LVL3 (GOWN DISPOSABLE) ×1 IMPLANT
GOWN STRL REUS W/ TWL XL LVL3 (GOWN DISPOSABLE) ×4 IMPLANT
GOWN STRL REUS W/TWL LRG LVL3 (GOWN DISPOSABLE) ×1
GOWN STRL REUS W/TWL XL LVL3 (GOWN DISPOSABLE) ×4
KIT ROOM TURNOVER OR (KITS) ×2 IMPLANT
NDL SUT .5 MAYO 1.404X.05X (NEEDLE) ×1 IMPLANT
NEEDLE MAYO TAPER (NEEDLE) ×1
NS IRRIG 1000ML POUR BTL (IV SOLUTION) ×2 IMPLANT
PACK ORTHO EXTREMITY (CUSTOM PROCEDURE TRAY) ×2 IMPLANT
PAD ABD 8X10 STRL (GAUZE/BANDAGES/DRESSINGS) ×2 IMPLANT
PAD ARMBOARD 7.5X6 YLW CONV (MISCELLANEOUS) ×4 IMPLANT
PADDING CAST ABS 6INX4YD NS (CAST SUPPLIES) ×1
PADDING CAST ABS COTTON 6X4 NS (CAST SUPPLIES) ×1 IMPLANT
SPONGE GAUZE 4X4 12PLY STER LF (GAUZE/BANDAGES/DRESSINGS) ×2 IMPLANT
SPONGE LAP 18X18 X RAY DECT (DISPOSABLE) ×2 IMPLANT
SUT ETHILON 2 0 PSLX (SUTURE) ×4 IMPLANT
SUT FIBERWIRE #2 38 T-5 BLUE (SUTURE) ×8
SUT MON AB 2-0 CT1 36 (SUTURE) ×2 IMPLANT
SUTURE FIBERWR #2 38 T-5 BLUE (SUTURE) ×4 IMPLANT
TOWEL OR 17X24 6PK STRL BLUE (TOWEL DISPOSABLE) ×2 IMPLANT
TOWEL OR 17X26 10 PK STRL BLUE (TOWEL DISPOSABLE) ×2 IMPLANT
TUBE CONNECTING 12X1/4 (SUCTIONS) ×4 IMPLANT
WATER STERILE IRR 1000ML POUR (IV SOLUTION) ×2 IMPLANT
YANKAUER SUCT BULB TIP NO VENT (SUCTIONS) ×4 IMPLANT

## 2016-09-09 NOTE — Anesthesia Preprocedure Evaluation (Addendum)
Anesthesia Evaluation  Patient identified by MRN, date of birth, ID band Patient awake    Reviewed: Allergy & Precautions, NPO status , Patient's Chart, lab work & pertinent test results  History of Anesthesia Complications Negative for: history of anesthetic complications  Airway Mallampati: II  TM Distance: >3 FB Neck ROM: Full    Dental  (+) Teeth Intact, Dental Advisory Given   Pulmonary asthma , former smoker,    breath sounds clear to auscultation       Cardiovascular hypertension, Pt. on medications and Pt. on home beta blockers  Rhythm:Regular     Neuro/Psych  Headaches, negative psych ROS   GI/Hepatic Neg liver ROS, GERD  Medicated and Controlled,  Endo/Other  negative endocrine ROS  Renal/GU negative Renal ROS     Musculoskeletal   Abdominal   Peds  Hematology negative hematology ROS (+)   Anesthesia Other Findings   Reproductive/Obstetrics                            Anesthesia Physical Anesthesia Plan  ASA: II  Anesthesia Plan: General and Regional   Post-op Pain Management:    Induction: Intravenous  Airway Management Planned: Oral ETT  Additional Equipment: None  Intra-op Plan:   Post-operative Plan: Extubation in OR  Informed Consent: I have reviewed the patients History and Physical, chart, labs and discussed the procedure including the risks, benefits and alternatives for the proposed anesthesia with the patient or authorized representative who has indicated his/her understanding and acceptance.   Dental advisory given  Plan Discussed with: CRNA and Kijowski  Anesthesia Plan Comments:         Anesthesia Quick Evaluation

## 2016-09-09 NOTE — Op Note (Signed)
09/09/2016  1:12 PM  PATIENT:  Dakota Doyle    PRE-OPERATIVE DIAGNOSIS:  Chronic Left Achilles Rupture  POST-OPERATIVE DIAGNOSIS:  Same  PROCEDURE:  LEFT ACHILLES RECONSTRUCTION  Mazzaferro:  Nadara MustardMarcus V Duda, MD  PHYSICIAN ASSISTANT:None ANESTHESIA:   General  PREOPERATIVE INDICATIONS:  Dakota Doyle is a  35 y.o. male with a diagnosis of Chronic Left Achilles Rupture who failed conservative measures and elected for surgical management.    The risks benefits and alternatives were discussed with the patient preoperatively including but not limited to the risks of infection, bleeding, nerve injury, cardiopulmonary complications, the need for revision surgery, among others, and the patient was willing to proceed.  OPERATIVE IMPLANTS: none  OPERATIVE FINDINGS: Chronic fibrinous tissue  OPERATIVE PROCEDURE: Patient brought the operating room after a regional block. After adequate levels anesthesia obtained patient's left lower extremity was prepped using DuraPrep draped into a sterile field a timeout was called. A longitudinal incision was made posterior medially along the Achilles this was carried down to the peritenon which was elevated the Achilles was extensively scarred down. The scar tissue was released. There is a possibly 2 cm of nonviable fibrinous tissue this was resected. Using Krakw suture technique 2 of the #2 FiberWire was sutured from the proximal and distal stump with 4 sutures exiting the proximal and distal stump. The foot was placed in plantarflexion and the Achilles underwent secondary reconstruction. Patient had good range of motion after reconstruction. The wound was irrigated with normal saline the peritenon was closed using 2-0 Monocryl. The skin was closed using 2-0 nylon with an Algower Donati suture technique to protect the posterior flap. A sterile dressing and a posterior and sugar tong splint was applied with the patient in plantarflexion patient was extubated taken  to the PACU in stable condition.

## 2016-09-09 NOTE — Transfer of Care (Signed)
Immediate Anesthesia Transfer of Care Note  Patient: Dakota HartPatrick Burkel  Procedure(s) Performed: Procedure(s): LEFT ACHILLES RECONSTRUCTION (Left)  Patient Location: PACU  Anesthesia Type:GA combined with regional for post-op pain  Level of Consciousness: awake, alert  and oriented  Airway & Oxygen Therapy: Patient Spontanous Breathing and Patient connected to nasal cannula oxygen  Post-op Assessment: Report given to RN and Post -op Vital signs reviewed and stable  Post vital signs: Reviewed and stable  Last Vitals:  Vitals:   09/09/16 1119 09/09/16 1325  BP: (!) 135/99   Pulse: 70 (P) 92  Resp: 20 (!) (P) 21  Temp: 36.9 C (!) (P) 36 C    Last Pain:  Vitals:   09/09/16 1119  TempSrc: Oral         Complications: No apparent anesthesia complications

## 2016-09-09 NOTE — Anesthesia Postprocedure Evaluation (Signed)
Anesthesia Post Note  Patient: Dakota HartPatrick Doyle  Procedure(s) Performed: Procedure(s) (LRB): LEFT ACHILLES RECONSTRUCTION (Left)  Patient location during evaluation: PACU Anesthesia Type: General and Regional Level of consciousness: awake Pain management: pain level controlled Vital Signs Assessment: post-procedure vital signs reviewed and stable Respiratory status: spontaneous breathing Cardiovascular status: stable Postop Assessment: no signs of nausea or vomiting Anesthetic complications: no    Last Vitals:  Vitals:   09/09/16 1430 09/09/16 1441  BP:  138/90  Pulse:  69  Resp:  18  Temp: 36.4 C     Last Pain:  Vitals:   09/09/16 1441  TempSrc:   PainSc: 2                  Kenise Barraco

## 2016-09-09 NOTE — Anesthesia Procedure Notes (Signed)
Procedure Name: Intubation Date/Time: 09/09/2016 12:33 PM Performed by: Edmonia CaprioAUSTON, Dorothye Berni M Pre-anesthesia Checklist: Patient identified, Emergency Drugs available, Suction available, Patient being monitored and Timeout performed Patient Re-evaluated:Patient Re-evaluated prior to inductionOxygen Delivery Method: Circle system utilized Preoxygenation: Pre-oxygenation with 100% oxygen Intubation Type: IV induction, Rapid sequence and Cricoid Pressure applied Laryngoscope Size: Miller and 1 Grade View: Grade II Tube type: Oral Tube size: 7.5 mm Number of attempts: 1 Airway Equipment and Method: Stylet Placement Confirmation: ETT inserted through vocal cords under direct vision,  positive ETCO2 and breath sounds checked- equal and bilateral Secured at: 23 cm Tube secured with: Tape Dental Injury: Teeth and Oropharynx as per pre-operative assessment

## 2016-09-09 NOTE — Progress Notes (Signed)
Orthopedic Tech Progress Note Patient Details:  Dakota Doyle Jan 09, 1981 [id number]  Ortho Devices Type of Ortho Device: Crutches   Saul FordyceJennifer C Wilford Merryfield 09/09/2016, 2:15 PM

## 2016-09-09 NOTE — H&P (Signed)
Dakota Doyle is an 35 y.o. male.   Chief Complaint: Pain and weakness left Achilles. HPI: Patient is a 35 year old gentleman who had a basketball injury back in February of this year. Patient complains of pain and weakness.  Past Medical History:  Diagnosis Date  . Asthma   . GERD (gastroesophageal reflux disease)   . Headache   . Hypertension     No past surgical history on file.  No family history on file. Social History:  reports that he quit smoking about 2 years ago. His smoking use included Cigars. He has never used smokeless tobacco. He reports that he drinks about 7.2 oz of alcohol per week . He reports that he does not use drugs.  Allergies:  Allergies  Allergen Reactions  . Mold Extract [Trichophyton] Anaphylaxis    No prescriptions prior to admission.    No results found for this or any previous visit (from the past 48 hour(s)). No results found.  Review of Systems  All other systems reviewed and are negative.   There were no vitals taken for this visit. Physical Exam  On examination patient has a increasing resting length of the Achilles on the left compared the right he has weakness. The skin is intact he has palpable pulses. 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  Plan: We will plan for reconstruction of the left Achilles tendon risk and benefits are discussed including infection neurovascular injury nonhealing the wound need for additional surgery. Patient states he understands wishes to proceed at this time.  Nadara MustardMarcus V Duda, MD 09/09/2016, 7:10 AM

## 2016-09-09 NOTE — Anesthesia Procedure Notes (Addendum)
Anesthesia Regional Block:  Popliteal block  Pre-Anesthetic Checklist: ,, timeout performed, Correct Patient, Correct Site, Correct Laterality, Correct Procedure, Correct Position, site marked, Risks and benefits discussed,  Surgical consent,  Pre-op evaluation,  At Stagliano's request and post-op pain management  Laterality: Lower and Left  Prep: chloraprep       Needles:  Injection technique: Single-shot  Needle Type: Echogenic Stimulator Needle          Additional Needles:  Procedures: ultrasound guided (picture in chart) Popliteal block Narrative:  Start time: 09/09/2016 12:11 PM End time: 09/09/2016 12:19 PM Injection made incrementally with aspirations every 5 mL.  Performed by: Personally  Anesthesiologist: Sumire Halbleib  Additional Notes: H+P and labs reviewed, risks and benefits discussed with patient, procedure tolerated well without complications

## 2016-09-10 ENCOUNTER — Encounter (HOSPITAL_COMMUNITY): Payer: Self-pay | Admitting: Orthopedic Surgery

## 2016-09-15 ENCOUNTER — Encounter (INDEPENDENT_AMBULATORY_CARE_PROVIDER_SITE_OTHER): Payer: Self-pay | Admitting: Orthopedic Surgery

## 2016-09-15 ENCOUNTER — Ambulatory Visit (INDEPENDENT_AMBULATORY_CARE_PROVIDER_SITE_OTHER): Payer: No Typology Code available for payment source | Admitting: Orthopedic Surgery

## 2016-09-15 DIAGNOSIS — S86012S Strain of left Achilles tendon, sequela: Secondary | ICD-10-CM

## 2016-09-15 NOTE — Progress Notes (Signed)
Patient is one-week status post left Achilles tendon reconstruction. He has a lot of swelling the wound edges are well approximated patient was given instructions in ice and elevation 4 x 4's and Ace wrap was applied he is placed in a fracture boot with 12/09/2014 since heel lifts. He will begin Dial soap cleansing he was given instructions to obtain a 15-20 mm compression stockings to be worn at all times follow-up in office in 2 weeks to remove the sutures. Patient was given instructions for gentle active range of motion to prevent adhesions of the peritenon. Harvest the sutures at follow-up.

## 2016-09-16 ENCOUNTER — Inpatient Hospital Stay (INDEPENDENT_AMBULATORY_CARE_PROVIDER_SITE_OTHER): Payer: Self-pay | Admitting: Orthopedic Surgery

## 2016-09-27 ENCOUNTER — Encounter (INDEPENDENT_AMBULATORY_CARE_PROVIDER_SITE_OTHER): Payer: Self-pay | Admitting: Orthopedic Surgery

## 2016-09-27 ENCOUNTER — Ambulatory Visit (INDEPENDENT_AMBULATORY_CARE_PROVIDER_SITE_OTHER): Payer: No Typology Code available for payment source | Admitting: Family

## 2016-09-27 VITALS — Ht 71.0 in | Wt 281.0 lb

## 2016-09-27 DIAGNOSIS — S86012S Strain of left Achilles tendon, sequela: Secondary | ICD-10-CM

## 2016-09-27 MED ORDER — OXYCODONE-ACETAMINOPHEN 5-325 MG PO TABS: 1.0000 | ORAL_TABLET | Freq: Three times a day (TID) | ORAL | 0 refills | Status: AC | PRN

## 2016-09-27 NOTE — Progress Notes (Signed)
   Wound Care Note   Patient: Dakota Doyle           Date of Birth: Jun 03, 1981           MRN: [id number]             PCP: Rinaldo CloudHarwani, Mohan, MD Visit Date: 09/27/2016   Assessment & Plan: Visit Diagnoses:  1. Achilles rupture, left, sequela     Plan: Continue protected weightbearing with fracture boot. Will work on dorsiflexion and range of motion. Sutures were harvested today. We'll continue with dry dressing.   Follow-Up Instructions: Return in about 2 weeks (around 10/11/2016).  Orders:  No orders of the defined types were placed in this encounter.  Meds ordered this encounter  Medications  . oxyCODONE-acetaminophen (PERCOCET/ROXICET) 5-325 MG tablet    Sig: Take 1 tablet by mouth every 8 (eight) hours as needed for severe pain.    Dispense:  45 tablet    Refill:  0      Procedures: No notes on file   Clinical Data: No additional findings.   No images are attached to the encounter.   Subjective: Chief Complaint  Patient presents with  . Left Foot - Routine Post Op    Left achilles tendon reconstruction    Patient is 2 weeks and 4 days s/p a left achilles tendon reconstruction. He is full weight bearing in a fracture boot with 2 9/16 heel lifts. Stitches were removed today. The incision is well approximated and there is no drainage. He is taking Percocet for pain and patient is requesting a refill of this today.    Review of Systems  Constitutional: Negative for chills and fever.  Musculoskeletal: Positive for gait problem and myalgias.      Objective: Vital Signs: Ht 5\' 11"  (1.803 m)   Wt 281 lb (127.5 kg)   BMI 39.19 kg/m   Physical Exam: Incision is healing well. Sutures were harvested today. There is no erythema drainage or sign of infection. Does have some swelling posteriorly. Palpation of the Achilles, is intact.  Specialty Comments: No specialty comments available.   PMFS History: Patient Active Problem List   Diagnosis Date Noted  .  Achilles rupture, left, sequela    Past Medical History:  Diagnosis Date  . Asthma   . GERD (gastroesophageal reflux disease)   . Headache   . Hypertension     No family history on file. Past Surgical History:  Procedure Laterality Date  . ACHILLES TENDON SURGERY Left 09/09/2016   Procedure: LEFT ACHILLES RECONSTRUCTION;  Lunden: Nadara MustardMarcus V Duda, MD;  Location: [facility] OR;  Service: Orthopedics;  Laterality: Left;   Social History   Occupational History  . Not on file.   Social History Main Topics  . Smoking status: Former Smoker    Types: Cigars    Quit date: 12/01/2013  . Smokeless tobacco: Never Used  . Alcohol use 7.2 oz/week    12 Shots of liquor per week  . Drug use: No  . Sexual activity: Not on file

## 2016-10-05 NOTE — Addendum Note (Signed)
Addendum  created 10/05/16 1720 by Val Eaglehristopher Hawley Michel, MD   Anesthesia Intra Blocks edited, Sign clinical note

## 2016-10-10 ENCOUNTER — Ambulatory Visit (INDEPENDENT_AMBULATORY_CARE_PROVIDER_SITE_OTHER): Payer: Self-pay | Admitting: Family

## 2016-10-10 ENCOUNTER — Encounter (INDEPENDENT_AMBULATORY_CARE_PROVIDER_SITE_OTHER): Payer: Self-pay | Admitting: Orthopedic Surgery

## 2016-10-10 DIAGNOSIS — S86012S Strain of left Achilles tendon, sequela: Secondary | ICD-10-CM

## 2016-10-10 NOTE — Progress Notes (Signed)
   Office Visit Note   Patient: Dakota Doyle           Date of Birth: 1981-08-15           MRN: [id number] Visit Date: 10/10/2016              Requested by: Rinaldo CloudMohan Harwani, MD [id number] [place] [address] PCP: Rinaldo CloudHarwani, Mohan, MD   Assessment & Plan: Visit Diagnoses: No diagnosis found.  Plan: Continue with heel cord stretching continue to advance activities as tolerated. Resume regular shoe wear. We'll follow-up in 4 more weeks.  Follow-Up Instructions: No Follow-up on file.   Orders:  No orders of the defined types were placed in this encounter.  No orders of the defined types were placed in this encounter.     Procedures: No procedures performed   Clinical Data: No additional findings.   Subjective: No chief complaint on file.   Patient presents for follow up left achilles reconstruction 09/09/16. He is wearing heel lift. He did discontinue compression stocking past two days. He is currently wearing dry dressing with ace bandage. There is no drainage. He does awaken in the evening at times with throbbing pain, and will have to take oxycodone for his pain on occasional basis.     Review of Systems  Constitutional: Negative for chills and fever.     Objective: Vital Signs: There were no vitals taken for this visit.  Physical Exam  Musculoskeletal:       Left ankle: Achilles tendon exhibits pain. Achilles tendon exhibits no defect and normal Thompson's test results.  Incision is well-healed. There are no open areas noted drainage no erythema no sign of infection.    Ortho Exam  Specialty Comments:  No specialty comments available.  Imaging: No results found.   PMFS History: Patient Active Problem List   Diagnosis Date Noted  . Achilles rupture, left, sequela    Past Medical History:  Diagnosis Date  . Asthma   . GERD (gastroesophageal reflux disease)   . Headache   . Hypertension     No family history on file.    Past Surgical History:  Procedure Laterality Date  . ACHILLES TENDON SURGERY Left 09/09/2016   Procedure: LEFT ACHILLES RECONSTRUCTION;  Moore: Nadara MustardMarcus V Duda, MD;  Location: [facility] OR;  Service: Orthopedics;  Laterality: Left;   Social History   Occupational History  . Not on file.   Social History Main Topics  . Smoking status: Former Smoker    Types: Cigars    Quit date: 12/01/2013  . Smokeless tobacco: Never Used  . Alcohol use 7.2 oz/week    12 Shots of liquor per week  . Drug use: No  . Sexual activity: Not on file

## 2016-11-07 ENCOUNTER — Ambulatory Visit (INDEPENDENT_AMBULATORY_CARE_PROVIDER_SITE_OTHER): Payer: No Typology Code available for payment source | Admitting: Orthopedic Surgery

## 2017-07-12 ENCOUNTER — Emergency Department (HOSPITAL_COMMUNITY): Payer: No Typology Code available for payment source

## 2017-07-12 ENCOUNTER — Emergency Department (HOSPITAL_COMMUNITY)
Admission: EM | Admit: 2017-07-12 | Discharge: 2017-07-13 | Disposition: A | Discharge status: Home / Self Care | Payer: No Typology Code available for payment source | Attending: Emergency Medicine | Admitting: Emergency Medicine

## 2017-07-12 ENCOUNTER — Encounter (HOSPITAL_COMMUNITY): Payer: Self-pay | Admitting: Emergency Medicine

## 2017-07-12 DIAGNOSIS — Z87891 Personal history of nicotine dependence: Secondary | ICD-10-CM | POA: Insufficient documentation

## 2017-07-12 DIAGNOSIS — J45909 Unspecified asthma, uncomplicated: Secondary | ICD-10-CM | POA: Insufficient documentation

## 2017-07-12 DIAGNOSIS — R531 Weakness: Secondary | ICD-10-CM

## 2017-07-12 DIAGNOSIS — I10 Essential (primary) hypertension: Secondary | ICD-10-CM | POA: Insufficient documentation

## 2017-07-12 DIAGNOSIS — E860 Dehydration: Secondary | ICD-10-CM

## 2017-07-12 LAB — BASIC METABOLIC PANEL
Anion gap: 6 (ref 5–15)
BUN: 8 mg/dL (ref 6–20)
CHLORIDE: 107 mmol/L (ref 101–111)
CO2: 25 mmol/L (ref 22–32)
Calcium: 9.1 mg/dL (ref 8.9–10.3)
Creatinine, Ser: 0.87 mg/dL (ref 0.61–1.24)
GFR calc non Af Amer: >60 mL/min (ref >60)
Glucose, Bld: 114 mg/dL — ABNORMAL HIGH (ref 65–99)
POTASSIUM: 3.5 mmol/L (ref 3.5–5.1)
SODIUM: 138 mmol/L (ref 135–145)

## 2017-07-12 LAB — CBC
HEMATOCRIT: 40.3 % (ref 39.0–52.0)
Hemoglobin: 13.2 g/dL (ref 13.0–17.0)
MCH: 31.1 pg (ref 26.0–34.0)
MCHC: 32.8 g/dL (ref 30.0–36.0)
MCV: 94.8 fL (ref 78.0–100.0)
Platelets: 225 10*3/uL (ref 150–400)
RBC: 4.25 MIL/uL (ref 4.22–5.81)
RDW: 13 % (ref 11.5–15.5)
WBC: 5.7 10*3/uL (ref 4.0–10.5)

## 2017-07-12 LAB — I-STAT TROPONIN, ED: Troponin i, poc: 0 ng/mL (ref 0.00–0.08)

## 2017-07-12 LAB — D-DIMER, QUANTITATIVE (NOT AT ARMC)

## 2017-07-12 MED ORDER — SODIUM CHLORIDE 0.9 % IV BOLUS (SEPSIS)
1000.0000 mL | Freq: Once | INTRAVENOUS | Status: AC
  Administered 2017-07-12: 1000 mL via INTRAVENOUS

## 2017-07-12 MED ORDER — KETOROLAC TROMETHAMINE 30 MG/ML IJ SOLN
30.0000 mg | Freq: Once | INTRAMUSCULAR | Status: AC
  Administered 2017-07-12: 30 mg via INTRAVENOUS
  Filled 2017-07-12: qty 1

## 2017-07-12 NOTE — ED Notes (Signed)
ED Provider at bedside. 

## 2017-07-12 NOTE — ED Triage Notes (Signed)
Pt reports headache x5 days, began having sob today. vss with ems, 12 lead ekg unremarkable. Pt a/ox4.

## 2017-07-12 NOTE — ED Notes (Signed)
Called lab to add on d-dimer to previous collection.

## 2017-07-13 NOTE — Discharge Instructions (Signed)
As we discussed, we believe her symptoms are caused today by mild volume depletion, or mild dehydration, without any evidence of damage to your body.  Please drink plenty of clear fluids such as water and/or Gatorade and follow up with your regular doctor or the doctors listed in his documentation at the next available opportunity.  Return to the emergency department with any new or worsening symptoms that concern you, including but not limited to fever, shortness of breath, chest pain, or other concerning symptoms. ° ° °Dehydration, Adult °Dehydration is when you lose more fluids from the body than you take in. Vital organs like the kidneys, brain, and heart cannot function without a proper amount of fluids and salt. Any loss of fluids from the body can cause dehydration.  °CAUSES  °Vomiting. °Diarrhea. °Excessive sweating. °Excessive urine output. °Fever. °SYMPTOMS  °Mild dehydration °Thirst. °Dry lips. °Slightly dry mouth. °Moderate dehydration °Very dry mouth. °Sunken eyes. °Skin does not bounce back quickly when lightly pinched and released. °Dark urine and decreased urine production. °Decreased tear production. °Headache. °Severe dehydration °Very dry mouth. °Extreme thirst. °Rapid, weak pulse (more than 100 beats per minute at rest). °Cold hands and feet. °Not able to sweat in spite of heat and temperature. °Rapid breathing. °Blue lips. °Confusion and lethargy. °Difficulty being awakened. °Minimal urine production. °No tears. °DIAGNOSIS  °Your caregiver will diagnose dehydration based on your symptoms and your exam. Blood and urine tests will help confirm the diagnosis. The diagnostic evaluation should also identify the cause of dehydration. °TREATMENT  °Treatment of mild or moderate dehydration can often be done at home by increasing the amount of fluids that you drink. It is best to drink small amounts of fluid more often. Drinking too much at one time can make vomiting worse. Refer to the home care  instructions below. °Severe dehydration needs to be treated at the hospital where you will probably be given intravenous (IV) fluids that contain water and electrolytes. °HOME CARE INSTRUCTIONS  °Ask your caregiver about specific rehydration instructions. °Drink enough fluids to keep your urine clear or pale yellow. °Drink small amounts frequently if you have nausea and vomiting. °Eat as you normally do. °Avoid: °Foods or drinks high in sugar. °Carbonated drinks. °Juice. °Extremely hot or cold fluids. °Drinks with caffeine. °Fatty, greasy foods. °Alcohol. °Tobacco. °Overeating. °Gelatin desserts. °Wash your hands well to avoid spreading bacteria and viruses. °Only take over-the-counter or prescription medicines for pain, discomfort, or fever as directed by your caregiver. °Ask your caregiver if you should continue all prescribed and over-the-counter medicines. °Keep all follow-up appointments with your caregiver. °SEEK MEDICAL CARE IF: °You have abdominal pain and it increases or stays in one area (localizes). °You have a rash, stiff neck, or severe headache. °You are irritable, sleepy, or difficult to awaken. °You are weak, dizzy, or extremely thirsty. °SEEK IMMEDIATE MEDICAL CARE IF:  °You are unable to keep fluids down or you get worse despite treatment. °You have frequent episodes of vomiting or diarrhea. °You have blood or green matter (bile) in your vomit. °You have blood in your stool or your stool looks black and tarry. °You have not urinated in 6 to 8 hours, or you have only urinated a small amount of very dark urine. °You have a fever. °You faint. °MAKE SURE YOU:  °Understand these instructions. °Will watch your condition. °Will get help right away if you are not doing well or get worse. °Document Released: 10/17/2005 Document Revised: 01/09/2012 Document Reviewed: 06/06/2011 °ExitCare® Patient Information ©2015   [employer]. This information is not intended to replace advice given to you by your health  care provider. Make sure you discuss any questions you have with your health care provider. ° °Rehydration, Adult °Rehydration is the replacement of body fluids lost during dehydration. Dehydration is an extreme loss of body fluids to the point of body function impairment. There are many ways extreme fluid loss can occur, including vomiting, diarrhea, or excess sweating. Recovering from dehydration requires replacing lost fluids, continuing to eat to maintain strength, and avoiding foods and beverages that may contribute to further fluid loss or may increase nausea. °HOW TO REHYDRATE °In most cases, rehydration involves the replacement of not only fluids but also carbohydrates and basic body salts. Rehydration with an oral rehydration solution is one way to replace essential nutrients lost through dehydration. °An oral rehydration solution can be purchased at pharmacies, retail stores, and online. Premixed packets of powder that you combine with water to make a solution are also sold. You can prepare an oral rehydration solution at home by mixing the following ingredients together:  ° - tsp table salt. °¾ tsp baking soda. ° tsp salt substitute containing potassium chloride. °1 tablespoons sugar. °1 L (34 oz) of water. °Be sure to use exact measurements. Including too much sugar can make diarrhea worse. °Drink ½-1 cup (120-240 mL) of oral rehydration solution each time you have diarrhea or vomit. If drinking this amount makes your vomiting worse, try drinking smaller amounts more often. For example, drink 1-3 tsp every 5-10 minutes.  °A general rule for staying hydrated is to drink 1½-2 L of fluid per day. Talk to your caregiver about the specific amount you should be drinking each day. Drink enough fluids to keep your urine clear or pale yellow. °EATING WHEN DEHYDRATED °Even if you have had severe sweating or you are having diarrhea, do not stop eating. Many healthy items in a normal diet are okay to continue eating  while recovering from dehydration. The following tips can help you to lessen nausea when you eat: °Ask someone else to prepare your food. Cooking smells may worsen nausea. °Eat in a well-ventilated room away from cooking smells. °Sit up when you eat. Avoid lying down until 1-2 hours after eating. °Eat small amounts when you eat. °Eat foods that are easy to digest. These include soft, well-cooked, or mashed foods. °FOODS AND BEVERAGES TO AVOID °Avoid eating or drinking the following foods and beverages that may increase nausea or further loss of fluid:  °Fruit juices with a high sugar content, such as concentrated juices. °Alcohol. °Beverages containing caffeine. °Carbonated drinks. They may cause a lot of gas. °Foods that may cause a lot of gas, such as cabbage, broccoli, and beans. °Fatty, greasy, and fried foods. °Spicy, very salty, and very sweet foods or drinks. °Foods or drinks that are very hot or very cold. Consume food or drinks at or near room temperature. °Foods that need a lot of chewing, such as raw vegetables. °Foods that are sticky or hard to swallow, such as peanut butter. °Document Released: 01/09/2012 Document Revised: 07/11/2012 Document Reviewed: 01/09/2012 °ExitCare® Patient Information ©2015 [employer]. This information is not intended to replace advice given to you by your health care provider. Make sure you discuss any questions you have with your health care provider. ° ° ° °

## 2017-07-13 NOTE — ED Notes (Signed)
Pt ambulatory at discharge with family. Pt verbalizes understanding of d/c instructions.

## 2017-07-13 NOTE — ED Provider Notes (Signed)
Emergency Department Provider Note   I have reviewed the triage vital signs and the nursing notes.   HISTORY  Chief Complaint Headache and Shortness of Breath   HPI Dakota Doyle is a 36 y.o. male with PMH of chronic HA presents to the ED with chronic HA symptoms and new onset lightheadedness and mild dyspnea. No CP. No fever or chills. New symptoms started today and are made worse with lying flat. No nausea, vomiting, or diarrhea. No new medications or dose changes to existing medications. No syncope. No palpitations. No vertigo symptoms but does describe a dysequilibrium sensation at times. No radiation of symptoms. Moderate severity. No pain.   Dyspnea is mild and only appreciated when feeling lightheaded. HA is mild to moderate and feels similar to prior HAs. No sudden onset, maximal intensity HA symptoms.   Past Medical History:  Diagnosis Date  . Asthma   . GERD (gastroesophageal reflux disease)   . Headache   . Hypertension     Patient Active Problem List   Diagnosis Date Noted  . Achilles rupture, left, sequela     Past Surgical History:  Procedure Laterality Date  . ACHILLES TENDON SURGERY Left 09/09/2016   Procedure: LEFT ACHILLES RECONSTRUCTION;  [place]: Nadara Mustard, MD;  Location: [facility] OR;  Service: Orthopedics;  Laterality: Left;    Current Outpatient Rx  . Order #: [id number] Class: Historical Med  . Order #: [id number] Class: Historical Med  . Order #: [id number] Class: Historical Med  . Order #: [id number] Class: Historical Med  . Order #: [id number] Class: Normal  . Order #: [id number] Class: Print    Allergies Mold extract [trichophyton]  No family history on file.  Social History Social History  Substance Use Topics  . Smoking status: Former Smoker    Types: Cigars    Quit date: 12/01/2013  . Smokeless tobacco: Never Used  . Alcohol use 7.2 oz/week    12 Shots of liquor per week    Review of Systems  Constitutional: No fever/chills. Positive  lightheaded.  Eyes: No visual changes. ENT: No sore throat. Cardiovascular: Denies chest pain or palpitations.  Respiratory: Positive shortness of breath. Gastrointestinal: No abdominal pain. No nausea, no vomiting.  No diarrhea.  No constipation. Genitourinary: Negative for dysuria. Musculoskeletal: Negative for back pain. Skin: Negative for rash. Neurological: Negative for focal weakness or numbness. Positive for chronic HA.   10-point ROS otherwise negative.  ____________________________________________   PHYSICAL EXAM:  VITAL SIGNS: ED Triage Vitals  Enc Vitals Group     BP 07/12/17 1653 (!) 136/96     Pulse Rate 07/12/17 1653 96     Resp 07/12/17 1653 16     Temp 07/12/17 1653 98.4 F (36.9 C)     Temp src --      SpO2 07/12/17 1653 98 %     Pain Score 07/12/17 1649 10   Constitutional: Alert and oriented. Well appearing and in no acute distress. Eyes: Conjunctivae are normal. PERRL. Head: Atraumatic. Nose: No congestion/rhinnorhea. Mouth/Throat: Mucous membranes are moist.  Neck: No stridor.   Cardiovascular: Normal rate, regular rhythm. Good peripheral circulation. Grossly normal heart sounds.   Respiratory: Normal respiratory effort.  No retractions. Lungs CTAB. Gastrointestinal: Soft and nontender. No distention.  Musculoskeletal: No lower extremity tenderness nor edema. No gross deformities of extremities. Neurologic:  Normal speech and language. No gross focal neurologic deficits are appreciated. Normal finger-to-nose testing. No pronator drift. Normal CN exam 2-12.  Skin:  Skin is warm, dry and intact. No rash  noted.  ____________________________________________   LABS (all labs ordered are listed, but only abnormal results are displayed)  Labs Reviewed  BASIC METABOLIC PANEL - Abnormal; Notable for the following:       Result Value   Glucose, Bld 114 (*)    All other components within normal limits  CBC  D-DIMER, QUANTITATIVE (NOT AT [facility])  I-STAT  TROPONIN, ED   ____________________________________________  EKG   EKG Interpretation  Date/Time:  Wednesday July 12 2017 16:45:34 EDT Ventricular Rate:  88 PR Interval:  162 QRS Duration: 94 QT Interval:  356 QTC Calculation: 430 R Axis:   44 Text Interpretation:  Normal sinus rhythm Normal ECG No STEMI.  Confirmed by Alona BeneLong, Joshua [phone]([id number]) on 07/12/2017 9:47:08 PM       ____________________________________________  RADIOLOGY  Dg Chest 2 View  Result Date: 07/12/2017 CLINICAL DATA:  Shortness of breath. EXAM: CHEST  2 VIEW COMPARISON:  None. FINDINGS: The heart size and mediastinal contours are within normal limits. Both lungs are clear. The visualized skeletal structures are unremarkable. IMPRESSION: No active cardiopulmonary disease. Electronically Signed   By: Sherian ReinWei-Chen  Lin M.D.   On: 07/12/2017 17:22    ____________________________________________   PROCEDURES  Procedure(s) performed:   Procedures  None ____________________________________________   INITIAL IMPRESSION / ASSESSMENT AND PLAN / ED COURSE  Pertinent labs & imaging results that were available during my care of the patient were reviewed by me and considered in my medical decision making (see chart for details).  Patient presents to the ED for evaluation of chronic HA symptoms with new onset lightheadedness and mild dyspnea. Low PE suspicion here but d-dimer ordered, which was negative. Normal EKG and troponin. Patient feeling much better after IVF and re-evaluation. [facility] or other emergency cause for HA. Plan for discharge and PCP follow up.   At this time, I do not feel there is any life-threatening condition present. I have reviewed and discussed all results (EKG, imaging, lab, urine as appropriate), exam findings with patient. I have reviewed nursing notes and appropriate previous records.  I feel the patient is safe to be discharged home without further emergent workup.  Discussed usual and customary return precautions. Patient and family (if present) verbalize understanding and are comfortable with this plan.  Patient will follow-up with their primary care provider. If they do not have a primary care provider, information for follow-up has been provided to them. All questions have been answered.  ____________________________________________  FINAL CLINICAL IMPRESSION(S) / ED DIAGNOSES  Final diagnoses:  Generalized weakness  Dehydration     MEDICATIONS GIVEN DURING THIS VISIT:  Medications  sodium chloride 0.9 % bolus 1,000 mL (0 mLs Intravenous Stopped 07/13/17 0106)  ketorolac (TORADOL) 30 MG/ML injection 30 mg (30 mg Intravenous Given 07/12/17 2327)     NEW OUTPATIENT MEDICATIONS STARTED DURING THIS VISIT:  None   Note:  This document was prepared using Dragon voice recognition software and may include unintentional dictation errors.  Alona BeneJoshua Long, MD Emergency Medicine   Long, Arlyss RepressJoshua G, MD 07/13/17 [phone]

## 2018-08-28 ENCOUNTER — Encounter (HOSPITAL_COMMUNITY): Payer: Self-pay | Admitting: Emergency Medicine

## 2018-08-28 ENCOUNTER — Emergency Department (HOSPITAL_COMMUNITY)
Admission: EM | Admit: 2018-08-28 | Discharge: 2018-08-28 | Disposition: A | Discharge status: Home / Self Care | Payer: No Typology Code available for payment source | Attending: Emergency Medicine | Admitting: Emergency Medicine

## 2018-08-28 DIAGNOSIS — J45909 Unspecified asthma, uncomplicated: Secondary | ICD-10-CM | POA: Insufficient documentation

## 2018-08-28 DIAGNOSIS — Z87891 Personal history of nicotine dependence: Secondary | ICD-10-CM | POA: Insufficient documentation

## 2018-08-28 DIAGNOSIS — Z79899 Other long term (current) drug therapy: Secondary | ICD-10-CM | POA: Insufficient documentation

## 2018-08-28 DIAGNOSIS — F10920 Alcohol use, unspecified with intoxication, uncomplicated: Secondary | ICD-10-CM

## 2018-08-28 DIAGNOSIS — E876 Hypokalemia: Secondary | ICD-10-CM | POA: Insufficient documentation

## 2018-08-28 DIAGNOSIS — I10 Essential (primary) hypertension: Secondary | ICD-10-CM | POA: Insufficient documentation

## 2018-08-28 LAB — CBC
HEMATOCRIT: 42.3 % (ref 39.0–52.0)
HEMOGLOBIN: 13.7 g/dL (ref 13.0–17.0)
MCH: 31.6 pg (ref 26.0–34.0)
MCHC: 32.4 g/dL (ref 30.0–36.0)
MCV: 97.5 fL (ref 80.0–100.0)
NRBC: 0 % (ref 0.0–0.2)
Platelets: 229 10*3/uL (ref 150–400)
RBC: 4.34 MIL/uL (ref 4.22–5.81)
RDW: 12.1 % (ref 11.5–15.5)
WBC: 6.1 10*3/uL (ref 4.0–10.5)

## 2018-08-28 LAB — COMPREHENSIVE METABOLIC PANEL
ALBUMIN: 4.1 g/dL (ref 3.5–5.0)
ALT: 32 U/L (ref 0–44)
ANION GAP: 12 (ref 5–15)
AST: 22 U/L (ref 15–41)
Alkaline Phosphatase: 40 U/L (ref 38–126)
BUN: 14 mg/dL (ref 6–20)
CHLORIDE: 106 mmol/L (ref 98–111)
CO2: 19 mmol/L — AB (ref 22–32)
Calcium: 8.9 mg/dL (ref 8.9–10.3)
Creatinine, Ser: 0.87 mg/dL (ref 0.61–1.24)
GFR calc Af Amer: >60 mL/min (ref >60)
GFR calc non Af Amer: >60 mL/min (ref >60)
GLUCOSE: 193 mg/dL — AB (ref 70–99)
Potassium: 2.9 mmol/L — ABNORMAL LOW (ref 3.5–5.1)
SODIUM: 137 mmol/L (ref 135–145)
Total Bilirubin: 0.4 mg/dL (ref 0.3–1.2)
Total Protein: 6.7 g/dL (ref 6.5–8.1)

## 2018-08-28 LAB — ETHANOL: Alcohol, Ethyl (B): 159 mg/dL — ABNORMAL HIGH (ref <10)

## 2018-08-28 NOTE — ED Notes (Signed)
Pt and family verbalized understanding of discharge instructions.  Pt able to ambulate, going home w/ family.

## 2018-08-28 NOTE — ED Provider Notes (Signed)
[facility] EMERGENCY DEPARTMENT Provider Note  CSN: [id number] Arrival date & time: 08/28/18 0127  Chief Complaint(s) Alcohol Intoxication  HPI Dakota Doyle is a 37 y.o. male who presents to the emergency department after being found minimally responsive at a club.  Patient was brought in by EMS.  CBG reassuring.  Patient reported drinking heavily last night and endorsed marijuana use.  Currently denies any physical complaints including nausea, chest pain, shortness of breath, abdominal pain, headache, focal deficits.  HPI  Past Medical History Past Medical History:  Diagnosis Date  . Asthma   . GERD (gastroesophageal reflux disease)   . Headache   . Hypertension    Patient Active Problem List   Diagnosis Date Noted  . Achilles rupture, left, sequela    Home Medication(s) Prior to Admission medications   Medication Sig Start Date End Date Taking? Authorizing Provider  acetaminophen (TYLENOL) 325 MG tablet Take 650 mg by mouth every 6 (six) hours as needed for mild pain or headache.    [provider]  albuterol (PROVENTIL HFA;VENTOLIN HFA) 108 (90 BASE) MCG/ACT inhaler Inhale 2 puffs into the lungs every 6 (six) hours as needed for wheezing or shortness of breath.    [provider]  aspirin-acetaminophen-caffeine (EXCEDRIN MIGRAINE) [phone] MG tablet Take 2 tablets by mouth every 6 (six) hours as needed for headache.    [provider]  ibuprofen (ADVIL,MOTRIN) 200 MG tablet Take 200 mg by mouth every 6 (six) hours as needed for headache or moderate pain.    [provider]  naproxen (NAPROSYN) 375 MG tablet Take 1 tablet (375 mg total) by mouth 2 (two) times daily. Patient not taking: Reported on 07/12/2017 07/12/16   Hayden Rasmussen, NP  oxyCODONE-acetaminophen (PERCOCET/ROXICET) 5-325 MG tablet Take 1 tablet by mouth every 8 (eight) hours as needed for severe pain. Patient not taking: Reported on 10/10/2016 09/27/16    Adonis Huguenin, NP                                                                                                                                    Past Surgical History Past Surgical History:  Procedure Laterality Date  . ACHILLES TENDON SURGERY Left 09/09/2016   Procedure: LEFT ACHILLES RECONSTRUCTION;  [place]: Nadara Mustard, MD;  Location: [facility] OR;  Service: Orthopedics;  Laterality: Left;   Family History No family history on file.  Social History Social History   Tobacco Use  . Smoking status: Former Smoker    Types: Cigars    Last attempt to quit: 12/01/2013    Years since quitting: 4.7  . Smokeless tobacco: Never Used  Substance Use Topics  . Alcohol use: Yes    Alcohol/week: 12.0 standard drinks    Types: 12 Shots of liquor per week  . Drug use: No   Allergies Mold extract [trichophyton]  Review of Systems Review of Systems All other systems are  reviewed and are negative for acute change except as noted in the HPI  Physical Exam Vital Signs  I have reviewed the triage vital signs BP 117/79 (BP Location: Right Arm)   Pulse 80   Temp (!) 97.5 F (36.4 C) (Oral)   Resp 16   SpO2 96%   Physical Exam  Constitutional: He is oriented to person, place, and time. He appears well-developed and well-nourished. No distress.  Clinically sober  HENT:  Head: Normocephalic and atraumatic.  Nose: Nose normal.  Eyes: Pupils are equal, round, and reactive to light. Conjunctivae and EOM are normal. Right eye exhibits no discharge. Left eye exhibits no discharge. No scleral icterus.  Neck: Normal range of motion. Neck supple.  Cardiovascular: Normal rate and regular rhythm. Exam reveals no gallop and no friction rub.  No murmur heard. Pulmonary/Chest: Effort normal and breath sounds normal. No stridor. No respiratory distress. He has no rales.  Abdominal: Soft. He exhibits no distension. There is no tenderness.  Musculoskeletal: He exhibits no edema or tenderness.    Neurological: He is alert and oriented to person, place, and time.  Skin: Skin is warm and dry. No rash noted. He is not diaphoretic. No erythema.  Psychiatric: He has a normal mood and affect.  Vitals reviewed.   ED Results and Treatments Labs (all labs ordered are listed, but only abnormal results are displayed) Labs Reviewed  COMPREHENSIVE METABOLIC PANEL - Abnormal; Notable for the following components:      Result Value   Potassium 2.9 (*)    CO2 19 (*)    Glucose, Bld 193 (*)    All other components within normal limits  ETHANOL - Abnormal; Notable for the following components:   Alcohol, Ethyl (B) 159 (*)    All other components within normal limits  CBC                                                                                                                         EKG  EKG Interpretation  Date/Time:  Tuesday August 28 2018 01:41:44 EDT Ventricular Rate:  73 PR Interval:  170 QRS Duration: 100 QT Interval:  398 QTC Calculation: 438 R Axis:   19 Text Interpretation:  Normal sinus rhythm ST elevation, consider early repolarization, pericarditis, or injury Nonspecific T wave abnormality Abnormal ECG No significant change since last tracing Confirmed by Drema Pry [id number]) on 08/28/2018 3:26:24 AM      Radiology No results found. Pertinent labs & imaging results that were available during my care of the patient were reviewed by me and considered in my medical decision making (see chart for details).  Medications Ordered in ED Medications - No data to display  Procedures Procedures  (including critical care time)  Medical Decision Making / ED Course I have reviewed the nursing notes for this encounter and the patient's prior records (if available in EHR or on provided paperwork).    Patient presents with alcohol intoxication.   He is clinically sober at this time.  Screening labs obtained in triage were grossly reassuring other than mild hypokalemia.  Family is at bedside.   The patient appears reasonably screened and/or stabilized for discharge and I doubt any other medical condition or other [facility] requiring further screening, evaluation, or treatment in the ED at this time prior to discharge.  The patient is safe for discharge with strict return precautions.   Final Clinical Impression(s) / ED Diagnoses Final diagnoses:  Alcoholic intoxication without complication (HCC)  Hypokalemia   Disposition: Discharge  Condition: Good  I have discussed the results, Dx and Tx plan with the patient who expressed understanding and agree(s) with the plan. Discharge instructions discussed at great length. The patient was given strict return precautions who verbalized understanding of the instructions. No further questions at time of discharge.    ED Discharge Orders    None       Follow Up: Rinaldo Cloud, MD [phone] [place] [address] [phone]  Schedule an appointment as soon as possible for a visit  As needed      This chart was dictated using voice recognition software.  Despite best efforts to proofread,  errors can occur which can change the documentation meaning.   Nira Conn, MD 08/28/18 [phone]

## 2018-08-28 NOTE — ED Triage Notes (Signed)
Per EMS, pt was picked up at club, "passed out at table... 4 shots back to back".  Pt is alert of voice.  CBG 199, given 4mg  of zofran and 150NS.  ETOH and marijuana abuse

## 2018-08-28 NOTE — ED Notes (Signed)
Family at bedside, wants to take pt home.  Provider bedside.

## 2021-07-07 ENCOUNTER — Ambulatory Visit (HOSPITAL_COMMUNITY)
Admission: EM | Admit: 2021-07-07 | Discharge: 2021-07-07 | Disposition: A | Discharge status: Home / Self Care | Payer: No Typology Code available for payment source

## 2021-07-07 ENCOUNTER — Encounter (HOSPITAL_COMMUNITY): Payer: Self-pay

## 2021-07-07 ENCOUNTER — Emergency Department (HOSPITAL_COMMUNITY)
Admission: EM | Admit: 2021-07-07 | Discharge: 2021-07-07 | Disposition: A | Discharge status: Home / Self Care | Payer: Self-pay | Attending: Emergency Medicine | Admitting: Emergency Medicine

## 2021-07-07 ENCOUNTER — Encounter (HOSPITAL_COMMUNITY): Payer: Self-pay | Admitting: Emergency Medicine

## 2021-07-07 ENCOUNTER — Other Ambulatory Visit: Payer: Self-pay

## 2021-07-07 DIAGNOSIS — R197 Diarrhea, unspecified: Secondary | ICD-10-CM | POA: Insufficient documentation

## 2021-07-07 DIAGNOSIS — R112 Nausea with vomiting, unspecified: Secondary | ICD-10-CM

## 2021-07-07 DIAGNOSIS — R1030 Lower abdominal pain, unspecified: Secondary | ICD-10-CM

## 2021-07-07 DIAGNOSIS — R110 Nausea: Secondary | ICD-10-CM | POA: Insufficient documentation

## 2021-07-07 DIAGNOSIS — R1084 Generalized abdominal pain: Secondary | ICD-10-CM | POA: Insufficient documentation

## 2021-07-07 DIAGNOSIS — R6883 Chills (without fever): Secondary | ICD-10-CM | POA: Insufficient documentation

## 2021-07-07 DIAGNOSIS — Z5321 Procedure and treatment not carried out due to patient leaving prior to being seen by health care provider: Secondary | ICD-10-CM | POA: Insufficient documentation

## 2021-07-07 DIAGNOSIS — Z751 Person awaiting admission to adequate facility elsewhere: Secondary | ICD-10-CM

## 2021-07-07 LAB — POCT URINALYSIS DIPSTICK, ED / UC
Bilirubin Urine: NEGATIVE
Glucose, UA: NEGATIVE mg/dL
Hgb urine dipstick: NEGATIVE
Ketones, ur: NEGATIVE mg/dL
Leukocytes,Ua: NEGATIVE
Nitrite: NEGATIVE
Protein, ur: NEGATIVE mg/dL
Specific Gravity, Urine: 1.025 (ref 1.005–1.030)
Urobilinogen, UA: 1 mg/dL (ref 0.0–1.0)
pH: 6 (ref 5.0–8.0)

## 2021-07-07 NOTE — ED Notes (Signed)
Pt called 3x for room placement and VS update. Eloped from waiting area.

## 2021-07-07 NOTE — ED Provider Notes (Signed)
Patient here today for evaluation of diarrhea, significant lower abdominal pain that has been present intermittently over the past few weeks but got significantly worse last night.  He rates his pain as an 8 out of 10 today and was a 10 on 10 last night.  He reports he did have some nausea and vomiting last night as well as chills and fever.  I recommended further evaluation in the emergency room for CT, stat labs.  Patient is agreeable to same.   Tomi Bamberger, PA-C 07/07/21 1138

## 2021-07-07 NOTE — ED Notes (Signed)
Pt called for in ED lobby for vitals assessment, no answer x3. [employer]

## 2021-07-07 NOTE — ED Notes (Signed)
Pt called for in ED lobby for vitals assessment, no answer x2. [employer]

## 2021-07-07 NOTE — ED Triage Notes (Signed)
Pt reports having abd pains last week that was intermittent. Reports his pain got worse yesterday and having diarrhea. Reports taking half bottle Pepto. Pt also wanting to be checked for STDs. Denies discharge or dysuira, but some sensitivity.

## 2021-07-07 NOTE — ED Triage Notes (Signed)
Patient states generalized abd pain radiating through to back with diarrhea, nausea, and chills x2 weeks. Patient states pepto with relief.

## 2021-12-30 ENCOUNTER — Other Ambulatory Visit: Payer: Self-pay | Admitting: Urology

## 2021-12-30 ENCOUNTER — Other Ambulatory Visit (HOSPITAL_COMMUNITY): Payer: Self-pay | Admitting: Urology

## 2021-12-30 DIAGNOSIS — R109 Unspecified abdominal pain: Secondary | ICD-10-CM

## 2022-01-13 ENCOUNTER — Other Ambulatory Visit: Payer: Self-pay

## 2022-01-13 ENCOUNTER — Ambulatory Visit (HOSPITAL_COMMUNITY)
Admission: RE | Admit: 2022-01-13 | Discharge: 2022-01-13 | Disposition: A | Discharge status: Home / Self Care | Payer: Self-pay | Source: Ambulatory Visit | Attending: Urology | Admitting: Urology

## 2022-01-13 DIAGNOSIS — R109 Unspecified abdominal pain: Secondary | ICD-10-CM | POA: Insufficient documentation

## 2022-01-13 LAB — POCT I-STAT CREATININE: Creatinine, Ser: 0.9 mg/dL (ref 0.61–1.24)

## 2022-01-13 MED ORDER — IOHEXOL 300 MG/ML  SOLN
100.0000 mL | Freq: Once | INTRAMUSCULAR | Status: AC | PRN
  Administered 2022-01-13: 100 mL via INTRAVENOUS

## 2022-01-13 MED ORDER — SODIUM CHLORIDE (PF) 0.9 % IJ SOLN
INTRAMUSCULAR | Status: AC
  Filled 2022-01-13: qty 50

## 2023-06-11 IMAGING — CT CT ABD-PELV W/ CM
2 of 5 series · 16 of 46 positions shown, 18 images · IV contrast (APPLIED)
Comparison: None.

CLINICAL DATA: Acute abdominal pain x2 years

EXAM:
CT ABDOMEN AND PELVIS WITH CONTRAST
TECHNIQUE: Multidetector CT imaging of the abdomen and pelvis was performed
using the standard protocol following bolus administration of
intravenous contrast.

[Series 2: axial st · axial · 0.98mm/px · z∈[+1082,+1547]mm · 13 of 109 slices shown, 15 images]
[im 8/109  soft-tissue]
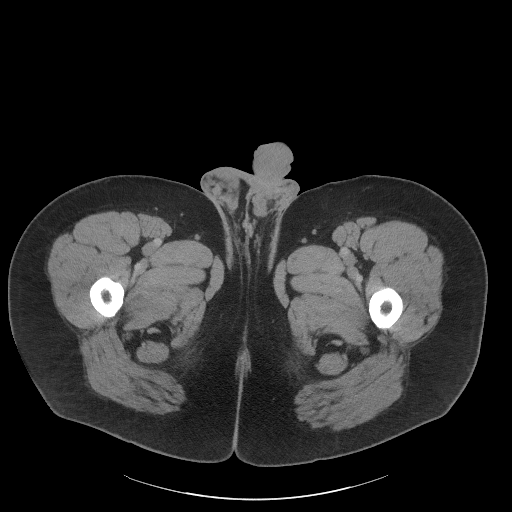
[im 8/109  bone]
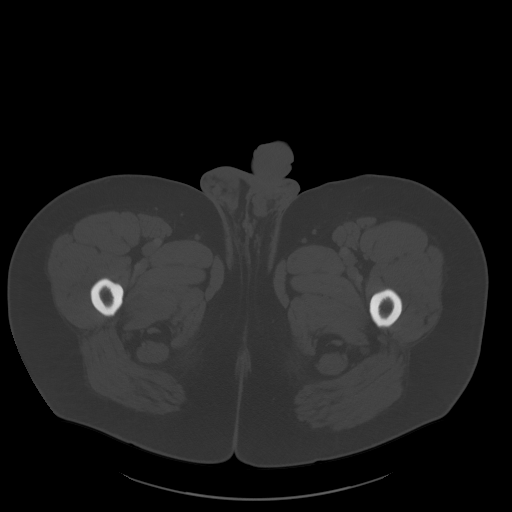
[im 15/109  soft-tissue]
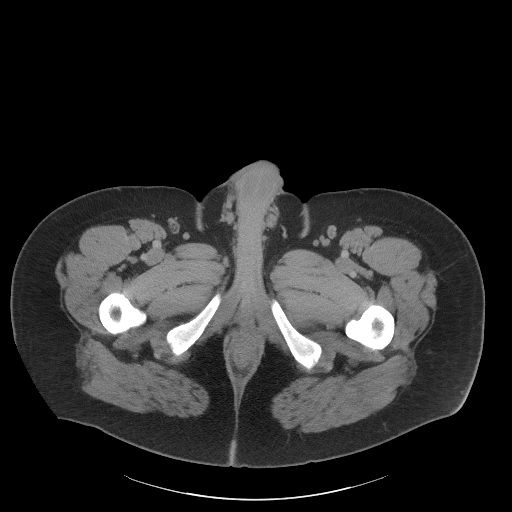
[im 22/109  soft-tissue]
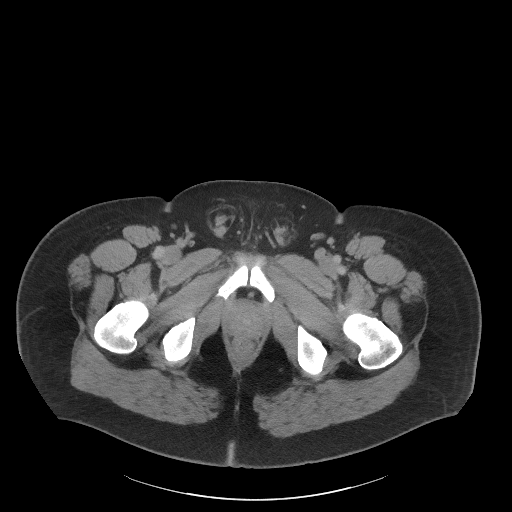
[im 29/109  soft-tissue]
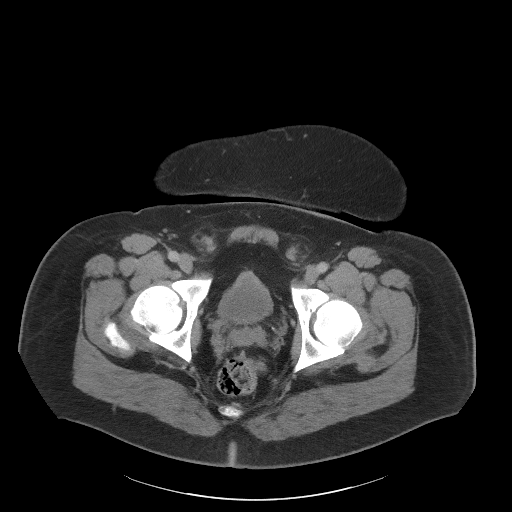
[im 37/109  soft-tissue]
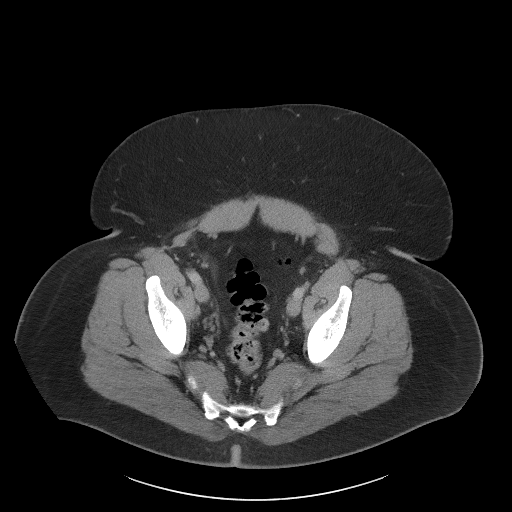
[im 44/109  soft-tissue]
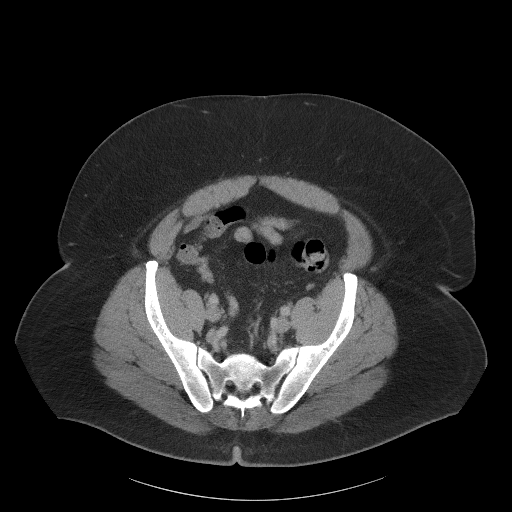
[im 58/109  soft-tissue]
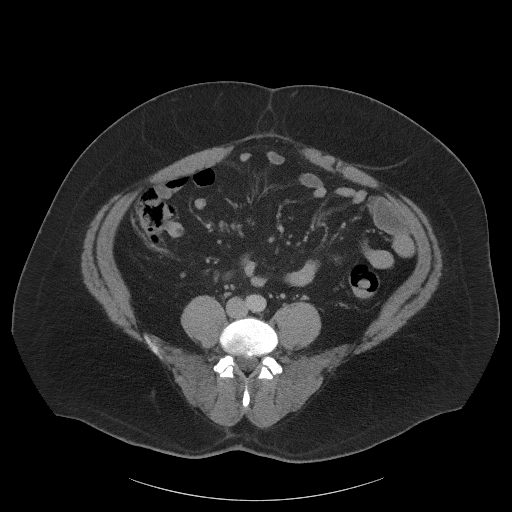
[im 65/109  soft-tissue]
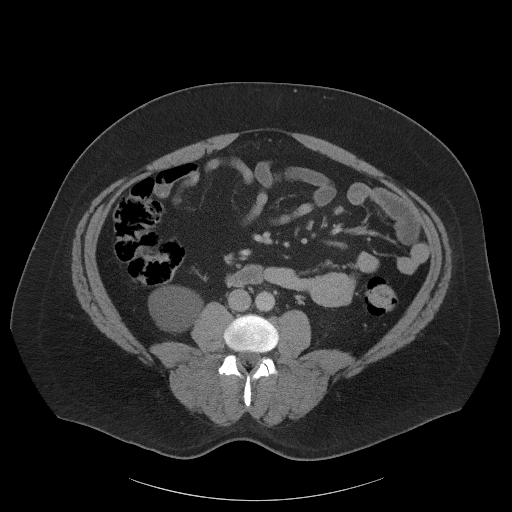
[im 73/109  soft-tissue]
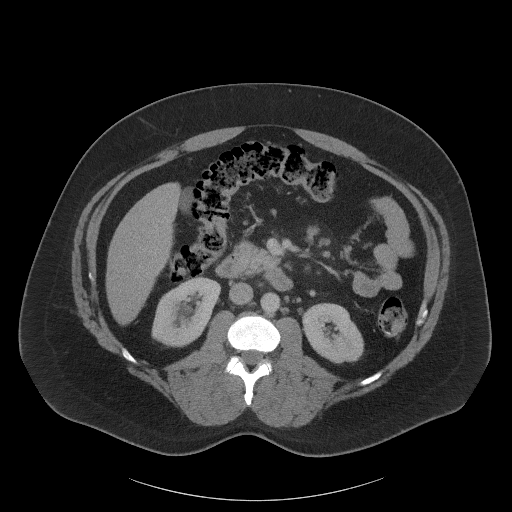
[im 73/109  bone]
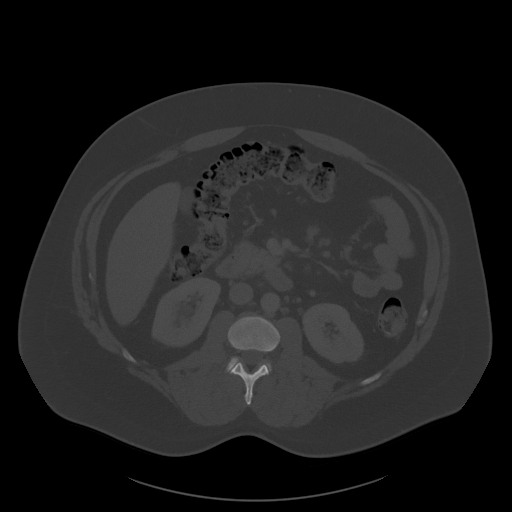
[im 80/109  soft-tissue]
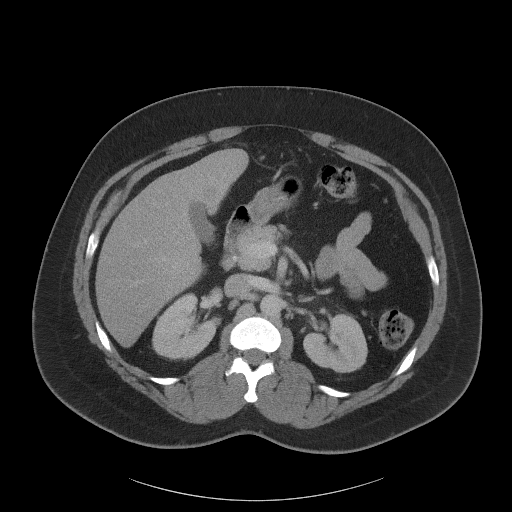
[im 87/109  soft-tissue]
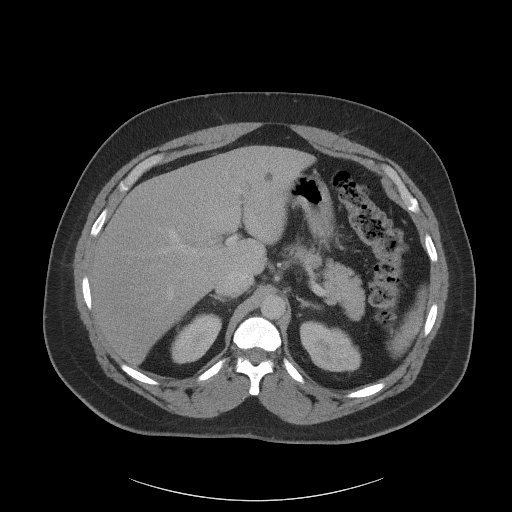
[im 94/109  soft-tissue]
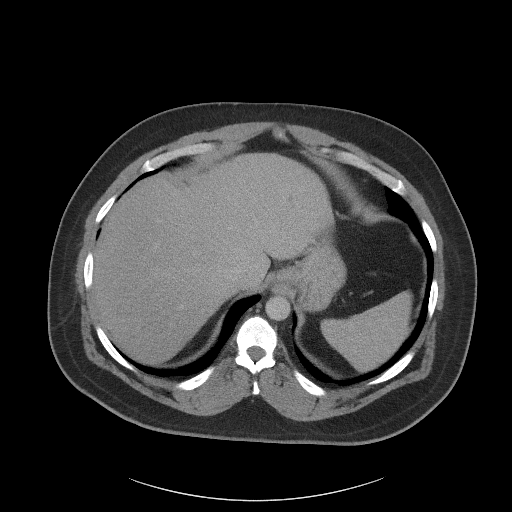
[im 101/109  soft-tissue]
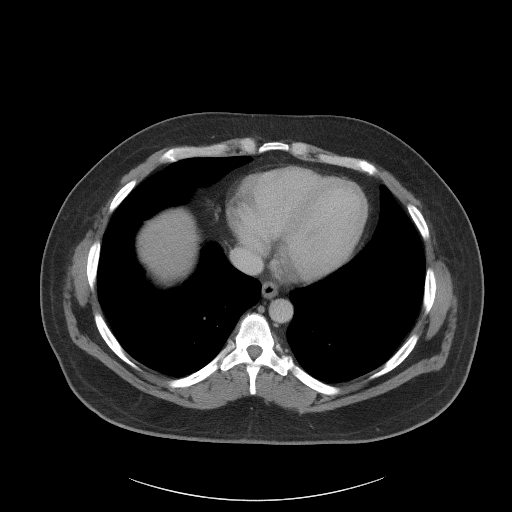

[Series 5: coronal st · coronal · 0.96mm/px · 3 of 134 slices shown]
[im 45/134  soft-tissue]
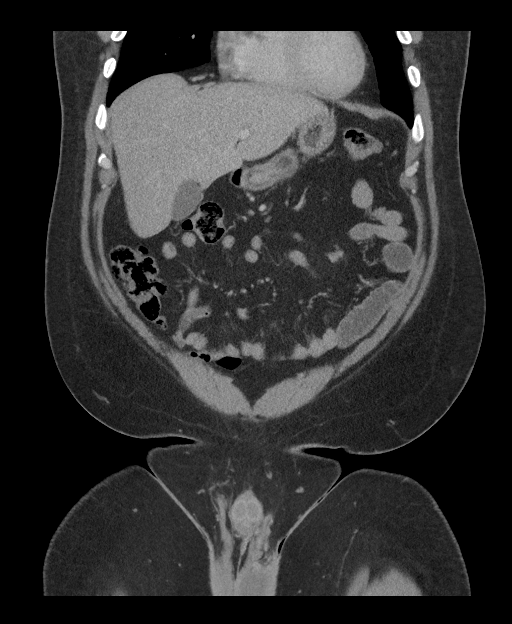
[im 60/134  soft-tissue]
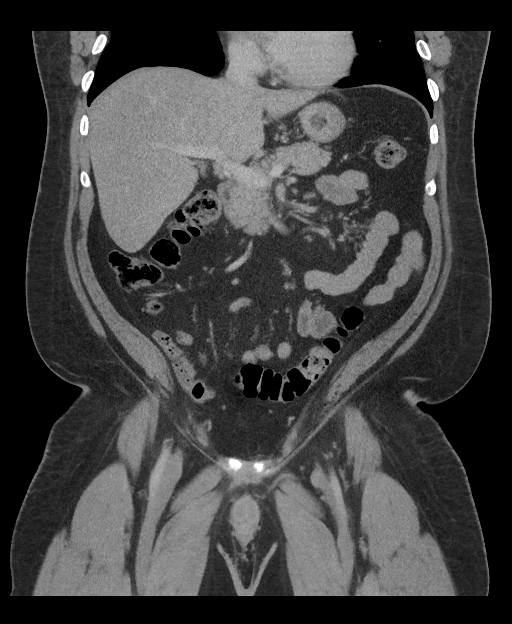
[im 74/134  soft-tissue]
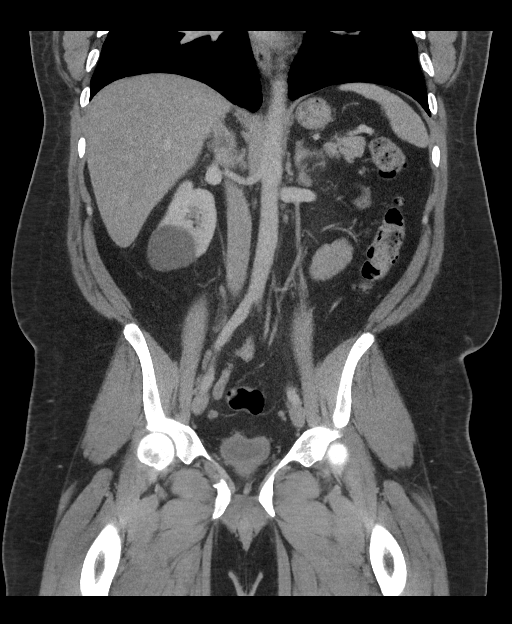

[16 of 46 positions shown; findings below may reference images not displayed]

RADIATION DOSE REDUCTION: This exam was performed according to the
departmental dose-optimization program which includes automated
exposure control, adjustment of the mA and/or kV according to
patient size and/or use of iterative reconstruction technique.

CONTRAST:  100mL OMNIPAQUE IOHEXOL 300 MG/ML  SOLN
FINDINGS: Lower chest: No acute abnormality.

Hepatobiliary: No calcified gallstones or biliary ductal dilatation.
8 mm probable cyst, hepatic segment 3.

Pancreas: Unremarkable. No pancreatic ductal dilatation or
surrounding inflammatory changes.

Spleen: Normal in size without focal abnormality.

Adrenals/Urinary Tract: No adrenal mass. 5.6 cm right lower pole
renal cyst. No urolithiasis or hydronephrosis. Urinary bladder
nondistended.

Stomach/Bowel: Stomach and small bowel unremarkable. Normal
appendix. A few scattered diverticula from descending colon without
adjacent inflammatory change.

Vascular/Lymphatic: No significant vascular findings. Prominent
subcentimeter central mesenteric lymph nodes. No retroperitoneal or
pelvic adenopathy.

Reproductive: Prostate is unremarkable.

Other: Left pelvic phleboliths.  No ascites.  No free air.

Musculoskeletal: Early degenerative disc disease L4-S1. No fracture
or worrisome bone lesion.
IMPRESSION: 1. No acute findings.
2. A few scattered descending colon diverticula without suggestion
of diverticulitis.

## 2024-03-15 LAB — LAB REPORT - SCANNED
A1c: 5.3
EGFR (African American): 109

## 2024-04-23 ENCOUNTER — Encounter: Payer: Self-pay | Admitting: Dietician

## 2024-04-23 ENCOUNTER — Encounter: Payer: Self-pay | Attending: Cardiology | Admitting: Dietician

## 2024-04-23 NOTE — Progress Notes (Addendum)
 Medical Nutrition Therapy  Appointment Start time:  [phone]  Appointment End time:  1104  Primary concerns today: help with heart health and cholesterol Referral diagnosis: E66.01 (Obesity) Preferred learning style: no preference indicated (auditory, visual, hands on, no preference indicated) Learning readiness: preparation (not ready, contemplating, ready, change in progress)  NUTRITION ASSESSMENT  Anthropometrics Height: 71.5 in  Clinical Medical Hx: asthma, HTN, hypercholesterolemia, obesity Medications: losartan, rosuvastatin  Labs: Chol 232, LDL 159 Notable Signs/Symptoms: none noted  Lifestyle & Dietary Hx  Pt states the last time he checked his weight was in December, stating he was around 290 lbs. Pt states he started working out in January and now his clothes fit much different, stating he has lost weight. Pt states he does not want to check his weight until his birthday in July.  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   Estimated daily fluid intake: 2-2.5 liters Supplements: protein powder Sleep: better than it was, stating he is getting more rest now, since he has been working out starting in January. Stress / self-care: 5-9; some days are worse than others; lift weights help to be calmer; gospel music and motivational speakers Current average weekly physical activity: gym 4 days per week, 90-120 minutes (some elliptical and some weights), and walk at the park 3 days per week when not using the elliptical at the gym.  24-Hr Dietary Recall First Meal: skip but will have a protein shake Snack:  Second Meal: bowl of fruit and hot tea Snack: can of tuna with crackers Third Meal: greens, chicken legs Snack: can of tuna or chicken legs or ice cream sandwich or protein bar Beverages: water, coke zero, diet pepsi  Pt states he drinks alcohol on the weekends, stating he will drink about 2-3  cocktails.  NUTRITION DIAGNOSIS  NB-1.1 Food and nutrition-related knowledge deficit As related to referral for obesity and pt reports high cholesterol.  As evidenced by food recall and first visit with a dietitian.  NUTRITION INTERVENTION  Nutrition education (E-1) on the following topics:  Fruits & Vegetables: Aim to fill half your plate with a variety of fruits and vegetables. They are rich in vitamins, minerals, and fiber, and can help reduce the risk of chronic diseases. Choose a colorful assortment of fruits and vegetables to ensure you get a wide range of nutrients. Grains and Starches: Make at least half of your grain choices whole grains, such as [place] Doyle rice, whole wheat bread, and oats. Whole grains provide fiber, which aids in digestion and healthy cholesterol levels. Aim for whole forms of starchy vegetables such as potatoes, sweet potatoes, beans, peas, and corn, which are fiber rich and provide many vitamins and minerals.  Protein: Incorporate lean sources of protein, such as poultry, fish, beans, nuts, and seeds, into your meals. Protein is essential for building and repairing tissues, staying full, balancing blood sugar, as well as supporting immune function. Dairy: Include low-fat or fat-free dairy products like milk, yogurt, and cheese in your diet. Dairy foods are excellent sources of calcium and vitamin D, which are crucial for bone health.  Physical Activity: Aim for 60 minutes of physical activity daily. Regular physical activity promotes overall health-including helping to reduce risk for heart disease and diabetes, promoting mental health, and helping us  sleep better.  Encouraged pt to continue to eat balanced meals inclusive of non starchy vegetables 2 times a day 7 days a week Encouraged pt to  choose lean protein sources: limiting beef, pork, sausage, hotdogs, and lunch meat Encourage pt to choose healthy fats such as plant based limiting animal fats Encouraged pt to continue  to drink a minium 64 fluid ounces with half being plain water to satisfy proper hydration   Meal planning and prepping are crucial for achieving healthy weight goals and lowering cholesterol because they provide control and consistency over your diet. By proactively deciding what to eat, you're more likely to choose nutrient-dense, portion-controlled meals that align with your caloric and macronutrient needs, rather than resorting to impulsive, often unhealthy, choices. This deliberate approach helps prevent overeating and ensures a balanced intake of fruits, vegetables, lean proteins, and whole grains, which are essential for satiety and metabolic health. Furthermore, meal prepping allows you to cook with heart-healthy ingredients and cooking methods, minimizing saturated and trans fats commonly found in processed foods and restaurant meals. This direct control over ingredients significantly contributes to reducing dietary cholesterol intake and promoting a favorable lipid profile, ultimately supporting sustainable weight management and cardiovascular well-being.  Handouts Provided Include  Types of Fat (saturated vs unsaturated) Health Benefits of Physical Activity Meal Ideas Using the Plate Method for a Balanced Meal Plan  Learning Style & Readiness for Change Teaching method utilized: Visual & Auditory  Demonstrated degree of understanding via: Teach Back  Barriers to learning/adherence to lifestyle change: preparation  Goals Established by Pt Plan meals; use meal ideas handout to create breakfast, lunch and dinner ideas Increase non-starchy vegetables aim for 2 or more servings per day Continue physical activity  MONITORING & EVALUATION Dietary intake, weekly physical activity.  Next Steps  Patient is to return in one month for follow-up.

## 2024-05-02 ENCOUNTER — Encounter: Payer: Self-pay | Admitting: Cardiology

## 2024-05-23 ENCOUNTER — Encounter: Payer: Self-pay | Admitting: Dietician

## 2024-05-23 ENCOUNTER — Encounter: Attending: Cardiology | Admitting: Dietician

## 2024-05-23 NOTE — Progress Notes (Signed)
 Medical Nutrition Therapy Follow-up  Appointment Start time: 1   Appointment End time:  1120  Primary concerns today: help with heart health and cholesterol Referral diagnosis: E66.01 (Obesity) Preferred learning style: no preference indicated (auditory, visual, hands on, no preference indicated) Learning readiness: preparation (not ready, contemplating, ready, change in progress)  NUTRITION ASSESSMENT  Anthropometrics Height: 71.5 in Weight today: 266.3 lb   Body Composition Scale 05/23/2024  Current Body Weight 266.3  Total Body Fat % 31.6  Visceral Fat 22  Fat-Free Mass % 68.3   Total Body Water % 49.3  Muscle-Mass lbs 50.2  BMI 36.5  Body Fat Displacement          Torso  lbs 52.3         Left Leg  lbs 10.4         Right Leg  lbs 10.4         Left Arm  lbs 5.2         Right Arm   bs 5.2   Clinical Medical Hx: asthma, HTN, hypercholesterolemia, obesity Medications: losartan, rosuvastatin  Labs: Chol 232, LDL 159 Notable Signs/Symptoms: none noted  Lifestyle & Dietary Hx  Pt states he has increased intensity, stating he is running more than walking now. Pt states he needs to have more structured meal times and routines.   Estimated daily fluid intake: 2-2.5 liters Supplements: protein powder Sleep: better than it was, stating he is getting more rest now, since he has been working out starting in January. Stress / self-care: 5-9; some days are worse than others; lift weights help to be calmer; gospel music and motivational speakers Current average weekly physical activity: gym 4 days per week, 90-120 minutes (some elliptical and some weights), and walk at the park 3 days per week when not using the elliptical at the gym.  24-Hr Dietary Recall First Meal: skip but will have a protein shake Snack:  Second Meal: bowl of fruit and hot tea Snack: can of tuna with crackers Third Meal: greens, chicken legs Snack: can of tuna or chicken legs or ice cream sandwich or  protein bar Beverages: water, coke zero, diet pepsi  Pt states he drinks alcohol on the weekends, stating he will drink about 2-3 cocktails.  NUTRITION DIAGNOSIS  NB-1.1 Food and nutrition-related knowledge deficit As related to referral for obesity and pt reports high cholesterol.  As evidenced by food recall and first visit with a dietitian.  NUTRITION INTERVENTION  Nutrition education (E-1) on the following topics:  Prioritizing consistent meal and snack schedules is crucial for optimal nutrition, especially for patients. Establishing a routine helps regulate blood sugar levels, supports a healthy metabolism, and ensures the body receives a steady supply of essential nutrients throughout the day. This consistency can prevent extreme hunger, reduce the likelihood of overeating or making unhealthy food choices, and improve the effectiveness of dietary interventions for various health conditions. Ultimately, a structured approach to eating empowers patients to better manage their health and well-being. For individuals aiming to enhance their fitness and overall health, gradually increasing the intensity of physical activity is key. Starting with light activity and progressively moving towards moderate intensity allows the body to adapt safely, minimizing the risk of injury and burnout. Moderate-intensity activity, where you can talk but not sing, provides significant benefits to cardiovascular health, endurance, and metabolism. This methodical approach ensures sustainable progress, building a stronger and healthier body over time. Consuming adequate protein with each meal and snack is vital for meeting  a daily minimum goal, often around 80 grams, and for promoting sustained satiety. Protein is more satiating than carbohydrates or fats, meaning it helps you feel fuller for longer, reducing the likelihood of overeating or unhealthy snacking between meals. This can be particularly beneficial for weight  management and overall dietary adherence. Spreading protein intake throughout the day also supports muscle synthesis and maintenance, which is crucial for metabolism and strength, especially as we age or engage in physical activity.  Handouts Provided Include  Body Comp Scale report  Learning Style & Readiness for Change Teaching method utilized: Visual & Auditory  Demonstrated degree of understanding via: Teach Back  Barriers to learning/adherence to lifestyle change: preparation  Goals Established by Pt Plan meals; use meal ideas handout to create breakfast, lunch and dinner ideas Increase non-starchy vegetables aim for 2 or more servings per day New: increase physical activity intensity New: aim for a protein with every meal or snack; aim for 80 grams of protein per day. New: schedule meal and snacks; per patient, get more structured  MONITORING & EVALUATION Dietary intake, weekly physical activity.  Next Steps  Patient is to return in October for follow-up.

## 2024-08-23 ENCOUNTER — Encounter: Payer: Self-pay | Admitting: Dietician

## 2024-08-23 ENCOUNTER — Encounter: Attending: Cardiology | Admitting: Dietician

## 2024-08-23 VITALS — Ht 71.5 in | Wt 257.5 lb

## 2024-08-23 DIAGNOSIS — E669 Obesity, unspecified: Secondary | ICD-10-CM | POA: Diagnosis present

## 2024-08-23 NOTE — Progress Notes (Signed)
 Medical Nutrition Therapy Follow-up  Appointment Start time: 1014   Appointment End time:  1046  Primary concerns today: help with heart health and cholesterol Referral diagnosis: E66.01 (Obesity) Preferred learning style: no preference indicated (auditory, visual, hands on, no preference indicated) Learning readiness: preparation (not ready, contemplating, ready, change in progress)  NUTRITION ASSESSMENT  Anthropometrics Height: 71.5 in Weight today: 257. lb   Body Composition Scale 05/23/2024 08/24/2023  Current Body Weight 266.3 257.5  Total Body Fat % 31.6 30.5  Visceral Fat 22 21  Fat-Free Mass % 68.3 69.4   Total Body Water % 49.3 50.4  Muscle-Mass lbs 50.2 48.5  BMI 36.5 35.3  Body Fat Displacement           Torso  lbs 52.3 48.7         Left Leg  lbs 10.4 9.7         Right Leg  lbs 10.4 9.7         Left Arm  lbs 5.2 4.8         Right Arm   bs 5.2 4.8   Clinical Medical Hx: asthma, HTN, hypercholesterolemia, obesity Medications: losartan, rosuvastatin, phentermine  Labs: Chol 232, LDL 159 Notable Signs/Symptoms: none noted  Lifestyle & Dietary Hx  Pt is doing great. Pt states he is on a good path and this may be the last of our visits. Pt states he is exercising 4-5 days per week, at the gym or outside at the park (cardio, stair climbing and calisthenics). Pt states he has been working with a Psychologist, educational. Pt states his blood pressure has been better. Pt states he is getting better knowing his body after he ate. Pt states he has a plan when he is hungry. Pt states he is now on phentermine, stating his mouth is dry on the medication.  Estimated daily fluid intake: 2-2.5 liters Supplements: protein powder Sleep: better than it was, stating he is getting more rest now, since he has been working out starting in January. Stress / self-care: 5-9; some days are worse than others; lift weights help to be calmer; gospel music and motivational speakers Current average weekly  physical activity: gym 4 days per week, 90-120 minutes (some elliptical and some weights), and walk at the park 3 days per week when not using the elliptical at the gym or   24-Hr Dietary Recall First Meal: protein shake or protein bar, can of tuna and crackers Snack: bowl of fruit Second Meal: chicken wrap, nuggets and salad Snack: can of tuna with crackers Third Meal: greens, chicken legs Snack: chicken or other meat Beverages: water, coke zero, diet pepsi  NUTRITION DIAGNOSIS  NB-1.1 Food and nutrition-related knowledge deficit As related to referral for obesity and pt reports high cholesterol.  As evidenced by food recall and first visit with a dietitian.  NUTRITION INTERVENTION  Nutrition education (E-1) on the following topics:   Reducing saturated and trans fats is a key strategy for improving LDL (bad) cholesterol and supporting heart health. Practical steps to help: Reduce frequency: Limit how often you eat foods high in saturated and trans fats, such as fried foods, fatty cuts of meat, full-fat dairy, baked goods, and processed snacks. Reduce portion size: When you do choose these foods, keep portions small. Even a little less can make a big difference over time. Find healthier substitutes: Swap in heart-healthy fats like olive oil, avocado, nuts, or fatty fish (like salmon) instead of butter, lard, or processed oils. Choose lean meats,  low-fat dairy, and baked or grilled options over fried. These small, consistent changes can help lower LDL cholesterol and reduce your risk of heart disease.  Handouts Provided Include  Body Comp Scale report Types of Fat (saturated vs unsaturated)  Learning Style & Readiness for Change Teaching method utilized: Visual & Auditory  Demonstrated degree of understanding via: Teach Back  Barriers to learning/adherence to lifestyle change: preparation  Goals Established by Pt Plan meals; use meal ideas handout to create breakfast, lunch and  dinner ideas Increase non-starchy vegetables aim for 2 or more servings per day Continue: increase physical activity intensity Continue: aim for a protein with every meal or snack; aim for 80 grams of protein per day. New: schedule meal and snacks; per patient, get more structured  MONITORING & EVALUATION Dietary intake, weekly physical activity.  Next Steps  Patient will call for an appointment if needed.
